# Patient Record
Sex: Female | Born: 1980 | Race: Black or African American | Hispanic: No | Marital: Single | Smoking: Current every day smoker
Health system: Southern US, Community
[De-identification: ages and names within clinical notes are randomized; demographics above are authoritative.]

## PROBLEM LIST (undated history)

## (undated) DIAGNOSIS — K5900 Constipation, unspecified: Secondary | ICD-10-CM

## (undated) DIAGNOSIS — O009 Unspecified ectopic pregnancy without intrauterine pregnancy: Secondary | ICD-10-CM

## (undated) DIAGNOSIS — K649 Unspecified hemorrhoids: Secondary | ICD-10-CM

## (undated) HISTORY — PX: TUBAL LIGATION: SHX77

---
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
## 1999-03-09 ENCOUNTER — Emergency Department (HOSPITAL_COMMUNITY): Admission: EM | Admit: 1999-03-09 | Discharge: 1999-03-09 | Payer: Self-pay | Admitting: Emergency Medicine

## 2001-05-09 ENCOUNTER — Emergency Department (HOSPITAL_COMMUNITY): Admission: EM | Admit: 2001-05-09 | Discharge: 2001-05-09 | Payer: Self-pay | Admitting: Emergency Medicine

## 2001-11-09 ENCOUNTER — Inpatient Hospital Stay (HOSPITAL_COMMUNITY): Admission: AD | Admit: 2001-11-09 | Discharge: 2001-11-09 | Payer: Self-pay | Admitting: Obstetrics and Gynecology

## 2001-11-10 ENCOUNTER — Inpatient Hospital Stay (HOSPITAL_COMMUNITY): Admission: AD | Admit: 2001-11-10 | Discharge: 2001-11-12 | Payer: Self-pay | Admitting: *Deleted

## 2002-02-21 ENCOUNTER — Emergency Department (HOSPITAL_COMMUNITY): Admission: EM | Admit: 2002-02-21 | Discharge: 2002-02-21 | Payer: Self-pay | Admitting: Emergency Medicine

## 2002-02-22 ENCOUNTER — Emergency Department (HOSPITAL_COMMUNITY): Admission: EM | Admit: 2002-02-22 | Discharge: 2002-02-22 | Payer: Self-pay | Admitting: Emergency Medicine

## 2002-02-22 ENCOUNTER — Encounter: Payer: Self-pay | Admitting: Emergency Medicine

## 2004-11-28 ENCOUNTER — Emergency Department (HOSPITAL_COMMUNITY): Admission: EM | Admit: 2004-11-28 | Discharge: 2004-11-28 | Payer: Self-pay | Admitting: Emergency Medicine

## 2004-12-03 ENCOUNTER — Emergency Department (HOSPITAL_COMMUNITY): Admission: EM | Admit: 2004-12-03 | Discharge: 2004-12-03 | Payer: Self-pay | Admitting: Emergency Medicine

## 2005-07-03 ENCOUNTER — Ambulatory Visit (HOSPITAL_COMMUNITY): Admission: RE | Admit: 2005-07-03 | Discharge: 2005-07-03 | Payer: Self-pay | Admitting: Family Medicine

## 2008-05-16 ENCOUNTER — Inpatient Hospital Stay (HOSPITAL_COMMUNITY): Admission: AD | Admit: 2008-05-16 | Discharge: 2008-05-16 | Payer: Self-pay | Admitting: Obstetrics & Gynecology

## 2008-05-19 ENCOUNTER — Ambulatory Visit: Payer: Self-pay | Admitting: Obstetrics & Gynecology

## 2008-05-19 ENCOUNTER — Inpatient Hospital Stay (HOSPITAL_COMMUNITY): Admission: AD | Admit: 2008-05-19 | Discharge: 2008-05-19 | Payer: Self-pay | Admitting: Obstetrics & Gynecology

## 2008-05-22 ENCOUNTER — Inpatient Hospital Stay (HOSPITAL_COMMUNITY): Admission: AD | Admit: 2008-05-22 | Discharge: 2008-05-22 | Payer: Self-pay | Admitting: Obstetrics and Gynecology

## 2008-05-25 ENCOUNTER — Inpatient Hospital Stay (HOSPITAL_COMMUNITY): Admission: AD | Admit: 2008-05-25 | Discharge: 2008-05-25 | Payer: Self-pay | Admitting: Obstetrics & Gynecology

## 2008-05-28 ENCOUNTER — Inpatient Hospital Stay (HOSPITAL_COMMUNITY): Admission: AD | Admit: 2008-05-28 | Discharge: 2008-05-28 | Payer: Self-pay | Admitting: Obstetrics & Gynecology

## 2008-05-30 ENCOUNTER — Ambulatory Visit: Payer: Self-pay | Admitting: Family Medicine

## 2008-05-30 ENCOUNTER — Encounter: Payer: Self-pay | Admitting: Family Medicine

## 2008-05-30 ENCOUNTER — Ambulatory Visit (HOSPITAL_COMMUNITY): Admission: AD | Admit: 2008-05-30 | Discharge: 2008-05-31 | Payer: Self-pay | Admitting: Family Medicine

## 2008-06-01 ENCOUNTER — Inpatient Hospital Stay (HOSPITAL_COMMUNITY): Admission: AD | Admit: 2008-06-01 | Discharge: 2008-06-01 | Payer: Self-pay | Admitting: Obstetrics & Gynecology

## 2008-06-05 ENCOUNTER — Inpatient Hospital Stay (HOSPITAL_COMMUNITY): Admission: AD | Admit: 2008-06-05 | Discharge: 2008-06-05 | Payer: Self-pay | Admitting: Obstetrics & Gynecology

## 2008-06-15 ENCOUNTER — Ambulatory Visit: Payer: Self-pay | Admitting: Obstetrics and Gynecology

## 2010-07-06 ENCOUNTER — Emergency Department (HOSPITAL_COMMUNITY): Admission: EM | Admit: 2010-07-06 | Discharge: 2010-07-06 | Payer: Self-pay | Admitting: Emergency Medicine

## 2010-10-23 LAB — URINALYSIS, ROUTINE W REFLEX MICROSCOPIC
Bilirubin Urine: NEGATIVE
Bilirubin Urine: NEGATIVE
Glucose, UA: NEGATIVE mg/dL
Glucose, UA: NEGATIVE mg/dL
Hgb urine dipstick: NEGATIVE
Ketones, ur: NEGATIVE mg/dL
Nitrite: NEGATIVE
Protein, ur: NEGATIVE mg/dL
Specific Gravity, Urine: 1.032 — ABNORMAL HIGH (ref 1.005–1.030)
pH: 6 (ref 5.0–8.0)

## 2010-10-23 LAB — POCT PREGNANCY, URINE: Preg Test, Ur: NEGATIVE

## 2010-12-25 NOTE — Op Note (Signed)
Sara Morris, Sara Morris             ACCOUNT NO.:  [id number]   MEDICAL RECORD NO.:  [id number]          PATIENT TYPE:  AMB   LOCATION:  [facility]                          FACILITY:  [facility]   PHYSICIAN:  Tanya S. Shawnie Pons, M.D.   DATE OF BIRTH:  Sep 10, 1980   DATE OF PROCEDURE:  05/30/2008  DATE OF DISCHARGE:                               OPERATIVE REPORT   PREOPERATIVE DIAGNOSIS:  Ruptured ectopic pregnancy, failed methotrexate  treatment x2.   POSTOPERATIVE DIAGNOSIS:  Ruptured ectopic pregnancy, failed  methotrexate treatment x2.   PROCEDURES:  Laparoscopic left salpingectomy.   SURGEON:  Shelbie Proctor. Shawnie Pons, MD   ASSISTANT:  None.   ANESTHESIA:  General and local.   FINDINGS:  Left ectopic pregnancy and hemoperitoneum,  Fitz-Hugh-Curtis  adhesions up above the liver and multiple adhesions of the patient's  left tube to the uterus.  The right tube appears normal.   SPECIMENS:  Left tube to pathology.   ESTIMATED BLOOD LOSS:  250 mL.   COMPLICATIONS:  None immediately known.   REASON FOR PROCEDURE:  Briefly, the patient is a 30 year old gravida 1  who had a probable ectopic pregnancy with nonrising beta HCGs who was  treated for an ectopic pregnancy with methotrexate on May 19, 2008.  She had followup beta's but continued to arise on day seven, so she was  treated with methotrexate the second time.  Her day 4 beta HCG had gone  down, and she came in on day 6 on acute abdomen and markedly tender.  The patient's hemoglobin had dropped from 12.8 to 11.4, and she was in  so much pain.  An ultrasound revealed hemoperitoneum, so she was taken  immediately to the OR.   PROCEDURE:  The patient was taken to the OR where she was prepped and  draped in usual sterile fashion.  She was placed in dorsal lithotomy in  [place] stirrups.  Foley catheter was placed inside the bladder.  A single-  toothed tenaculum was placed on the anterior lip of the cervix.  An  uterine manipulator was then placed  into the cervix only and around the  single-toothed tenaculum.  Attention was then turned to the abdomen.  A  6 mL of 0.25% Marcaine were injected about the umbilicus, 2 ounces were  used to elevate the umbilical skin and the knife was used to make an  incision through the umbilicus, was carried down underlying fascia.  Peritoneal cavity was grasped with hemostats and entered sharply with  Metzenbaum scissors.  Once the peritoneal cavity was entered, an S-  retractor was placed inside to hold the opening and then both edges of  the fascia were tagged with 0 Vicryl suture on the UR6.  These were then  used to secure the [facility] trocar which was placed without difficulty and  pneumoperitoneum was created.  Camera was placed inside the abdomen and  hemoperitoneum was found.  The patient's right tube was looked, that  appeared to be normal.  The patient's left tube had adherent clot to it  and could not be well visualized initially.  The liver was inspected,  Fitz-Hugh-Curtis adhesions were noted.  A 5-mm port was placed in the  left lower quadrant under direct visualization and the [place] was used  to clear blood clot away from the tube at which time an ectopic  pregnancy was detected.  There was also numerous adhesions of the left  tube to the uterus.  A second 5-mm port was placed in the left abdominal  quadrant approximately 4 cm above the first port.  This one was also  placed under direct visualization without difficulty.  Attention was  then turned to the ectopic.  The filmy adhesions were taken down between  the tube and the uterus and starting at the cornu, the tube was taken  down with gyrus, also in between coaging and cutting until the tube was  successfully removed.  The left ovary appeared to be normal.  Once the  tube was removed, a 5-mm scope was placed in the upper port and an  Endocatch bag was used to remove the specimen through the 10-mm port.  This was accomplished easily  and the Hasson was put back, and there was  some mild bleeding at the edge of the tubal site remover on the edge of  the ovary where adhesions had been taken down from the uterus.  These  were cauterized with gyrus.  The irrigation was used plus suctioned to  clean out the pelvis of clot and debris.  Excellent hemostasis was noted  at the end and the two 5-mm ports were removed under direct  visualization.  The 10-mm port was also removed without difficulty.  The  patient's 10-mm port had the two  aforementioned 0 Vicryl sutures on the  UR6, 2 figures-of-eight was used to close the fascial defect in this  location.  A 3-0 Vicryl on X1 was then used to close the umbilical skin.  A 4-0 Vicryl on a PS2 was then used to close the two 5-mm ports with the  subcuticular stitch.  All instrument and all counts were correct x2.  The patient was awakened and taken to recovery room in stable condition.      Shelbie Proctor. Shawnie Pons, M.D.  Electronically Signed     TSP/MEDQ  D:  05/30/2008  T:  05/31/2008  Job:  [id number]

## 2010-12-28 NOTE — Discharge Summary (Signed)
[facility]  Patient:    Sara Morris, Sara Morris Visit Number: [id number] MRN: [id number]          Service Type: GYN Location: [facility] Attending Physician:  Amada Kingfisher. Dictated by:   Arlis Porta, M.D. Admit Date:  11/09/2001 Discharge Date: 11/09/2001                             Discharge Summary  DISCHARGE DIAGNOSES: 1. Pelvic inflammatory disease. 2. Pyelonephritis. 3. Trichomonas.  LABORATORY DATA: 1. Urinalysis showed dehydration consistent with specific gravity of greater    than 1.030, ketones of 15, protein of 30, negative nitrite, trace leukocyte    esterase.  Microscopic urine showed many squamous cells, 11-20 white blood    cells, a few bacteria and a few Trichomonas. 2. CBC showed a white blood cell count of 4.8, hemoglobin of 13.2, hematocrit    of 39.5, platelet count of 185,000. 3. Wet prep showed rare Trichomonas, a few clue cells, moderate white blood    cells and moderate bacteria. 4. The patient was negative for gonorrhea.  She was positive for Chlamydia. 5. She was negative for syphilis. 6. Urine pregnancy test was negative.  HOSPITAL COURSE:              This is a 30 year old African American female who presented with three days worth of right-sided pain that was worse with certain movements.  She denied any vaginal discharge, odor, itching, dyspareunia or dysuria.  She was originally seen in the [facility]U on November 09, 2001, which they found possible UTI, positive Trichomonas, and positive Chlamydia. She was treated with Zithromax and Rocephin, Flagyl and Macrobid, however, she re-presented to the MAU on November 10, 2001 for a recheck, in which case she was having continued pain.  She had not been taking her medications secondary to cost.  She was therefore admitted for IV antibiotics.  She was treated with clindamycin and gentamicin.  She was also given one dose of Flagyl.  By the time of discharge, the patient was tolerating p.o.  well, with stabilization of her pain.  She has been afebrile during her admission.  She will be discharged to home on doxycycline and Bactrim for 14 days.  A prescription was given to her partner for Trichomonas as well.  She was advised to use condoms.  DISCHARGE MEDICATIONS: 1. Doxycycline 100 mg p.o. b.i.d. x14 days. 2. Bactrim double-strength one tablet p.o. b.i.d. x14 days.   ACTIVITY:                     No restrictions.  The patient is to use condoms during sexual activity.  DIET:                         No restrictions.  WOUND CARE:                   Not applicable.  SPECIAL INSTRUCTIONS:         She is to come back to the hospital for fevers, worsening pain, or vomiting.  FOLLOWUP:                     She is to call [facility] at [phone] or try the Internal Medicine Clinic at [facility] at [phone] for long-term followup care.Dictated by:   Arlis Porta, M.D. Attending Physician:  Amada Kingfisher.  DD:  11/12/01 TD:  11/13/01 Job: [id number]

## 2011-05-13 LAB — DIFFERENTIAL
Eosinophils Absolute: 0
Lymphocytes Relative: 43
Lymphocytes Relative: 53 — ABNORMAL HIGH
Lymphs Abs: 2
Lymphs Abs: 2.4
Monocytes Relative: 5
Neutro Abs: 1.9
Neutrophils Relative %: 41 — ABNORMAL LOW
Neutrophils Relative %: 50

## 2011-05-13 LAB — TYPE AND SCREEN: Antibody Screen: NEGATIVE

## 2011-05-13 LAB — CBC
HCT: 31.1 — ABNORMAL LOW
HCT: 33.4 — ABNORMAL LOW
HCT: 35.5 — ABNORMAL LOW
HCT: 36
HCT: 37.5
Hemoglobin: 10.3 — ABNORMAL LOW
Hemoglobin: 10.6 — ABNORMAL LOW
Hemoglobin: 12
Hemoglobin: 12.1
Hemoglobin: 12.7
MCHC: 33.2
MCV: 98.4
MCV: 99.7
Platelets: 140 — ABNORMAL LOW
Platelets: 176
RBC: 3.38 — ABNORMAL LOW
RBC: 3.62 — ABNORMAL LOW
RBC: 3.67 — ABNORMAL LOW
RDW: 12.5
RDW: 12.8
WBC: 4.6
WBC: 4.6
WBC: 5.3
WBC: 5.5
WBC: 5.7
WBC: 6.2

## 2011-05-13 LAB — URINE MICROSCOPIC-ADD ON

## 2011-05-13 LAB — BUN
BUN: 6
BUN: 9

## 2011-05-13 LAB — URINALYSIS, ROUTINE W REFLEX MICROSCOPIC
Bilirubin Urine: NEGATIVE
Glucose, UA: NEGATIVE
Glucose, UA: NEGATIVE
Hgb urine dipstick: NEGATIVE
Nitrite: NEGATIVE
Specific Gravity, Urine: 1.02
Specific Gravity, Urine: 1.02
pH: 6
pH: 8

## 2011-05-13 LAB — HCG, QUANTITATIVE, PREGNANCY
hCG, Beta Chain, Quant, S: 1068 — ABNORMAL HIGH
hCG, Beta Chain, Quant, S: 1077 — ABNORMAL HIGH
hCG, Beta Chain, Quant, S: 1789 — ABNORMAL HIGH
hCG, Beta Chain, Quant, S: 2020 — ABNORMAL HIGH

## 2011-05-13 LAB — GC/CHLAMYDIA PROBE AMP, GENITAL
Chlamydia, DNA Probe: NEGATIVE
GC Probe Amp, Genital: NEGATIVE

## 2011-05-13 LAB — CREATININE, SERUM
GFR calc non Af Amer: >60
GFR calc non Af Amer: >60

## 2011-05-13 LAB — URINE CULTURE

## 2011-05-13 LAB — BASIC METABOLIC PANEL
GFR calc non Af Amer: >60
Glucose, Bld: 103 — ABNORMAL HIGH
Potassium: 3.9
Sodium: 138

## 2011-05-13 LAB — ABO/RH: ABO/RH(D): A POS

## 2011-05-13 LAB — WET PREP, GENITAL: Yeast Wet Prep HPF POC: NONE SEEN

## 2012-06-21 ENCOUNTER — Encounter (HOSPITAL_BASED_OUTPATIENT_CLINIC_OR_DEPARTMENT_OTHER): Payer: Self-pay | Admitting: *Deleted

## 2012-06-21 ENCOUNTER — Emergency Department (HOSPITAL_BASED_OUTPATIENT_CLINIC_OR_DEPARTMENT_OTHER)
Admission: EM | Admit: 2012-06-21 | Discharge: 2012-06-21 | Disposition: A | Discharge status: Home / Self Care | Payer: Self-pay | Attending: Emergency Medicine | Admitting: Emergency Medicine

## 2012-06-21 ENCOUNTER — Emergency Department (HOSPITAL_BASED_OUTPATIENT_CLINIC_OR_DEPARTMENT_OTHER): Payer: Self-pay

## 2012-06-21 DIAGNOSIS — Z791 Long term (current) use of non-steroidal anti-inflammatories (NSAID): Secondary | ICD-10-CM | POA: Insufficient documentation

## 2012-06-21 DIAGNOSIS — R109 Unspecified abdominal pain: Secondary | ICD-10-CM | POA: Insufficient documentation

## 2012-06-21 DIAGNOSIS — F17200 Nicotine dependence, unspecified, uncomplicated: Secondary | ICD-10-CM | POA: Insufficient documentation

## 2012-06-21 DIAGNOSIS — R300 Dysuria: Secondary | ICD-10-CM | POA: Insufficient documentation

## 2012-06-21 LAB — URINE MICROSCOPIC-ADD ON

## 2012-06-21 LAB — URINALYSIS, ROUTINE W REFLEX MICROSCOPIC
Glucose, UA: NEGATIVE mg/dL
Leukocytes, UA: NEGATIVE
pH: 6 (ref 5.0–8.0)

## 2012-06-21 MED ORDER — OXYCODONE-ACETAMINOPHEN 5-325 MG PO TABS: 1.0000 | ORAL_TABLET | Freq: Four times a day (QID) | ORAL | Status: DC | PRN

## 2012-06-21 MED ORDER — SULFAMETHOXAZOLE-TRIMETHOPRIM 800-160 MG PO TABS: 1.0000 | ORAL_TABLET | Freq: Two times a day (BID) | ORAL | Status: DC

## 2012-06-21 MED ORDER — KETOROLAC TROMETHAMINE 60 MG/2ML IM SOLN
60.0000 mg | Freq: Once | INTRAMUSCULAR | Status: AC
  Administered 2012-06-21: 60 mg via INTRAMUSCULAR
  Filled 2012-06-21: qty 2

## 2012-06-21 NOTE — ED Provider Notes (Signed)
History    This chart was scribed for Geoffery Lyons, MD, MD by Smitty Pluck, ED Scribe. The patient was seen in room MH06 and the patient's care was started at 9:08PM.   CSN: [id number]  Arrival date & time 06/21/12  1943     Chief Complaint  Patient presents with  . Back Pain    (Consider location/radiation/quality/duration/timing/severity/associated sxs/prior treatment) The history is provided by the patient. No language interpreter was used.   Sara Morris is a 31 y.o. female who presents to the Emergency Department complaining of constant, moderate, sharp, left lower back pain onset 4 days ago. Pt reports that breathing and movement aggravates the pain. Pt reports that she feels that she relieves pressure when she urinates. She reports that she was running a fever and vomiting 1 night ago. Pt reports that she had diarrhea 4 days ago and that she has not had bowel movement since onset. Denies dysuria, injury to back, cough, headache and any other pain. Pt had CT last night at [facility] with negative results. Pt reports that she was discharged with hydrocodone without relief.   History reviewed. No pertinent past medical history.  Past Surgical History  Procedure Date  . Tubal ligation     No family history on file.  History  Substance Use Topics  . Smoking status: Current Every Day Smoker  . Smokeless tobacco: Never Used  . Alcohol Use: 8.4 oz/week    14 Cans of beer per week    OB History    Grav Para Term Preterm Abortions TAB SAB Ect Mult Living                  Review of Systems  All other systems reviewed and are negative.  10 Systems reviewed and all are negative for acute change except as noted in the HPI.    Allergies  Review of patient's allergies indicates no known allergies.  Home Medications   Current Outpatient Rx  Name  Route  Sig  Dispense  Refill  . IBUPROFEN 800 MG PO TABS   Oral   Take 800 mg by mouth every 8  (eight) hours as needed.           BP 108/58  Pulse 110  Temp 99.3 F (37.4 C) (Oral)  Resp 16  Ht 5\' 7"  (1.702 m)  Wt 165 lb (74.844 kg)  BMI 25.84 kg/m2  SpO2 100%  LMP 06/08/2012  Physical Exam  Nursing note and vitals reviewed. Constitutional: She is oriented to person, place, and time. She appears well-developed and well-nourished.  HENT:  Head: Normocephalic and atraumatic.  Pulmonary/Chest: Effort normal. No respiratory distress.  Abdominal: There is tenderness in the left lower quadrant. There is guarding (voluntary). There is no rebound.  Musculoskeletal: Normal range of motion.       No c-spine, t-spine and l-spine tenderness to palpation     Neurological: She is alert and oriented to person, place, and time.  Skin: Skin is warm and dry.  Psychiatric: She has a normal mood and affect. Her behavior is normal.    ED Course  Procedures (including critical care time) DIAGNOSTIC STUDIES: Oxygen Saturation is 100% on room air, normal by my interpretation.    COORDINATION OF CARE: 9:12 PM Discussed ED treatment with pt  9:48 PM Ordered:     . [COMPLETED] ketorolac  60 mg Intramuscular Once       Labs Reviewed  URINALYSIS, ROUTINE W REFLEX MICROSCOPIC -  Abnormal; Notable for the following:    APPearance CLOUDY (*)     Specific Gravity, Urine 1.038 (*)     Bilirubin Urine SMALL (*)     Ketones, ur 15 (*)     Protein, ur 30 (*)     Urobilinogen, UA 2.0 (*)     All other components within normal limits  URINE MICROSCOPIC-ADD ON - Abnormal; Notable for the following:    Squamous Epithelial / LPF MANY (*)     Bacteria, UA FEW (*)     All other components within normal limits  PREGNANCY, URINE   Ct Abdomen Pelvis Wo Contrast  06/21/2012  *RADIOLOGY REPORT*  Clinical Data: Left flank pain.  CT ABDOMEN AND PELVIS WITHOUT CONTRAST  Technique:  Multidetector CT imaging of the abdomen and pelvis was performed following the standard protocol without intravenous  contrast.  Comparison: 06/05/2008  Findings: Minimal dependent atelectasis in the lung bases.  Heart is normal size.  No effusions.  Stomach, gallbladder, liver, spleen, pancreas, adrenals and right kidney are unremarkable.  There is mild left perinephric stranding. No hydronephrosis.  No visible renal or ureteral stones.  Small calcifications in the pelvis felt represent phleboliths.  Trace free fluid in the pelvis.  Uterus and adnexa have an unremarkable unenhanced appearance.  Appendix visualized and is normal.  Large and small bowel grossly unremarkable.  No acute bony abnormality.  IMPRESSION: Mild left perinephric stranding without hydronephrosis or visible stones.  This may reflect changes from recent passage of stone. This could also be related to infection.  Recommend correlation with urinalysis.   Original Report Authenticated By: Charlett Nose, M.D.      No diagnosis found.    MDM  The patient presents here with severe pain in the left flank in the absence of trauma, bowel, or urinary complaints.  She was seen at [facility] last night and had a ct scan that was okay.  She was given hydrocodone and discharged.  She comes here tonight complaining of her pain being worse and wanting a "second opinion".  I attempted to reassure her, however she became tearful and told me that I "just can't tell her everything is okay".  She said she was suffering and needed an explanation for her pain.  I then repeated the ct scan which showed mild stranding around the left kidney, the significance of which I am unsure.  This could be a recently passed stone or uti, however the urine is clear.  I will treat her with bactrim and percocet.  She is to follow up with her pcp if not improving.      I personally performed the services described in this documentation, which was scribed in my presence. The recorded information has been reviewed and is accurate.      Geoffery Lyons, MD 06/22/12 1017

## 2012-06-21 NOTE — ED Notes (Signed)
Pt reports left flank pain x 4 days- pain worse with breathing and movement

## 2012-06-21 NOTE — ED Notes (Signed)
Patient took 2 vicodin around 7pm w/o relief.

## 2013-04-13 ENCOUNTER — Emergency Department (HOSPITAL_BASED_OUTPATIENT_CLINIC_OR_DEPARTMENT_OTHER)
Admission: EM | Admit: 2013-04-13 | Discharge: 2013-04-13 | Disposition: A | Discharge status: Home / Self Care | Payer: PRIVATE HEALTH INSURANCE | Attending: Emergency Medicine | Admitting: Emergency Medicine

## 2013-04-13 ENCOUNTER — Encounter (HOSPITAL_BASED_OUTPATIENT_CLINIC_OR_DEPARTMENT_OTHER): Payer: Self-pay | Admitting: Emergency Medicine

## 2013-04-13 DIAGNOSIS — K644 Residual hemorrhoidal skin tags: Secondary | ICD-10-CM | POA: Insufficient documentation

## 2013-04-13 DIAGNOSIS — F411 Generalized anxiety disorder: Secondary | ICD-10-CM | POA: Insufficient documentation

## 2013-04-13 DIAGNOSIS — F17200 Nicotine dependence, unspecified, uncomplicated: Secondary | ICD-10-CM | POA: Insufficient documentation

## 2013-04-13 DIAGNOSIS — K649 Unspecified hemorrhoids: Secondary | ICD-10-CM

## 2013-04-13 HISTORY — DX: Unspecified hemorrhoids: K64.9

## 2013-04-13 HISTORY — DX: Constipation, unspecified: K59.00

## 2013-04-13 MED ORDER — HYDROCORTISONE ACETATE 25 MG RE SUPP: 25.0000 mg | Freq: Two times a day (BID) | RECTAL | Status: DC

## 2013-04-13 NOTE — ED Provider Notes (Signed)
CSN: [id number]     Arrival date & time 04/13/13  1912 History   First MD Initiated Contact with Patient 04/13/13 1931     Chief Complaint  Patient presents with  . Hemorrhoids   (Consider location/radiation/quality/duration/timing/severity/associated sxs/prior Treatment) The history is provided by the patient.  Sara Morris is a 32 y.o. female history of hemorrhoid, constipation here presenting with worsening hemorrhoids. There is hemorrhoids for the last 4 days. Attempted to apply Preparation H cream to the area without any relief. Went to [employer] several days ago and given ProctoFoam with no relief. Today was at work and she had more bleeding and increased pain. Denies being constipated now even though she had been constipated in the past.    Past Medical History  Diagnosis Date  . Hemorrhoid   . Constipation    Past Surgical History  Procedure Laterality Date  . Tubal ligation     History reviewed. No pertinent family history. History  Substance Use Topics  . Smoking status: Current Every Day Smoker -- 1.00 packs/day    Types: Cigarettes  . Smokeless tobacco: Never Used  . Alcohol Use: 8.4 oz/week    14 Cans of beer per week   OB History   Grav Para Term Preterm Abortions TAB SAB Ect Mult Living                 Review of Systems  Gastrointestinal: Positive for blood in stool and rectal pain.  All other systems reviewed and are negative.    Allergies  Review of patient's allergies indicates no known allergies.  Home Medications   Current Outpatient Rx  Name  Route  Sig  Dispense  Refill  . hydrocortisone-pramoxine (PROCTOFOAM-HC) rectal foam   Rectal   Place 1 applicator rectally 2 (two) times daily.         . traMADol (ULTRAM) 50 MG tablet   Oral   Take 50 mg by mouth every 6 (six) hours as needed for pain.         Marland Kitchen ibuprofen (ADVIL,MOTRIN) 800 MG tablet   Oral   Take 800 mg by mouth every 8 (eight) hours as needed.         Marland Kitchen  oxyCODONE-acetaminophen (PERCOCET/ROXICET) 5-325 MG per tablet   Oral   Take 1-2 tablets by mouth every 6 (six) hours as needed for pain.   15 tablet   0   . sulfamethoxazole-trimethoprim (BACTRIM DS,SEPTRA DS) 800-160 MG per tablet   Oral   Take 1 tablet by mouth 2 (two) times daily.   10 tablet   0    BP 116/69  Pulse 87  Temp(Src) 98.4 F (36.9 C) (Oral)  Resp 18  SpO2 100%  LMP 03/28/2013 Physical Exam  Nursing note and vitals reviewed. Constitutional: She is oriented to person, place, and time. She appears well-nourished.  Tearful, anxious   HENT:  Head: Normocephalic.  Mouth/Throat: Oropharynx is clear and moist.  Eyes: Conjunctivae are normal. Pupils are equal, round, and reactive to light.  Neck: Normal range of motion. Neck supple.  Cardiovascular: Normal rate.   Pulmonary/Chest: Effort normal and breath sounds normal. No respiratory distress. She has no wheezes. She has no rales.  Abdominal: Soft. Bowel sounds are normal. She exhibits no distension. There is no tenderness. There is no rebound.  Genitourinary:  Rectal- small external hemorrhoid posterior midline. Not thrombosed. Tender on exam. No active bleeding.   Musculoskeletal: Normal range of motion.  Neurological: She is alert  and oriented to person, place, and time.  Skin: Skin is warm and dry.  Psychiatric: She has a normal mood and affect. Her behavior is normal. Judgment and thought content normal.    ED Course  Procedures (including critical care time) Labs Review Labs Reviewed - No data to display Imaging Review No results found.  MDM  No diagnosis found. [name] is a 32 y.o. female here with painful hemorrhoids that is not actively bleeding or thrombosed. I will add anusol suppository and recommend sitz bath and surgery f/u.      Richardean Canal, MD 04/13/13 [phone]

## 2013-04-13 NOTE — ED Notes (Signed)
Pt with h/o hemorrhoids, was seen at [facility] Friday, given proctofoam, pt has used medication with no relief, today at work , pain got significantly worse and bleeding increased and became darker

## 2013-06-02 IMAGING — CT CT ABD-PELV W/O CM
2 of 4 series · 16 of 46 positions shown, 18 images · non-contrast
Comparison: 06/05/2008

CLINICAL DATA: Left flank pain.

CT ABDOMEN AND PELVIS WITHOUT CONTRAST
TECHNIQUE: Multidetector CT imaging of the abdomen and pelvis was
performed following the standard protocol without intravenous
contrast.

[Series 2: renal stone < 200 lbs 5.0 b31f · axial · 0.84mm/px · z∈[-468,-38]mm · 13 of 94 slices shown, 15 images]
[im 4/94  soft-tissue]
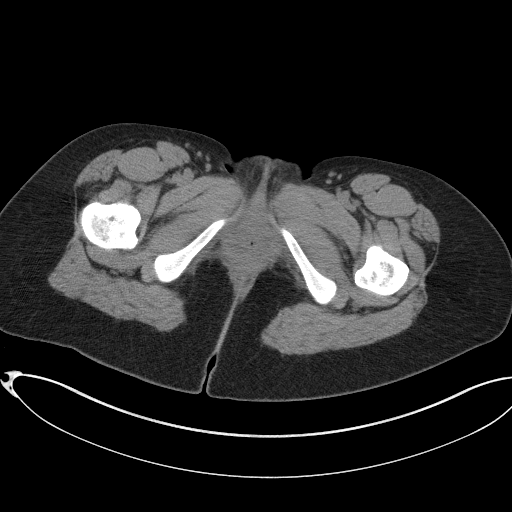
[im 4/94  bone]
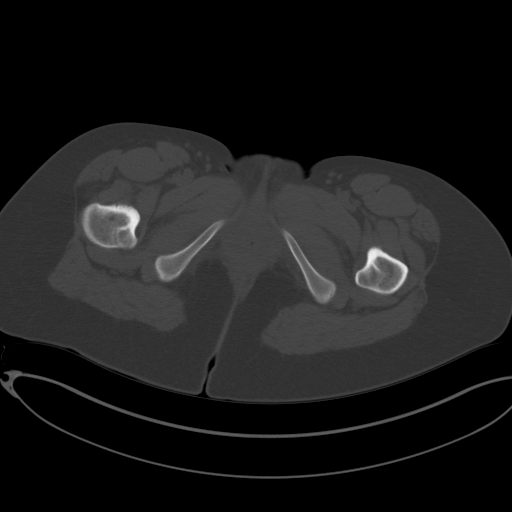
[im 12/94  soft-tissue]
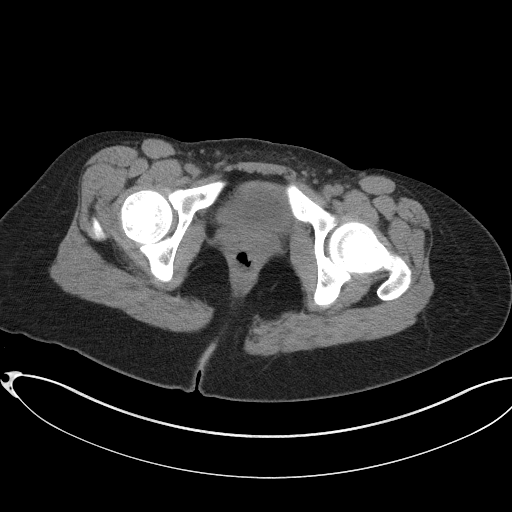
[im 20/94  soft-tissue]
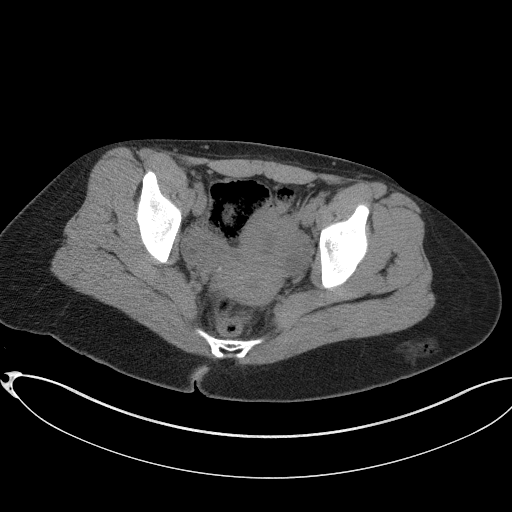
[im 28/94  soft-tissue]
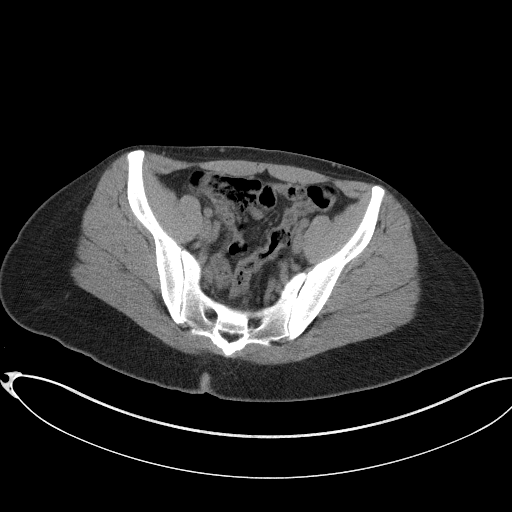
[im 32/94  soft-tissue]
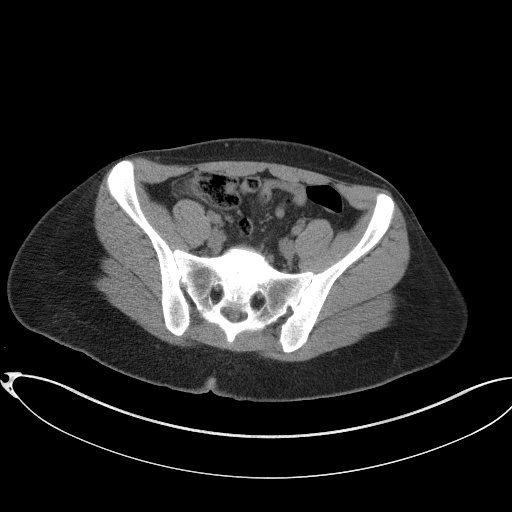
[im 39/94  soft-tissue]
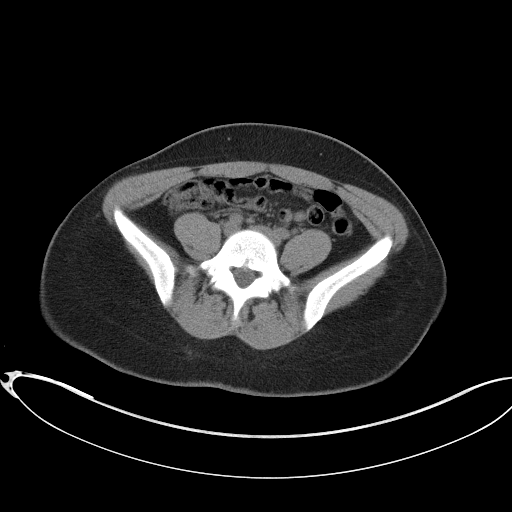
[im 47/94  soft-tissue]
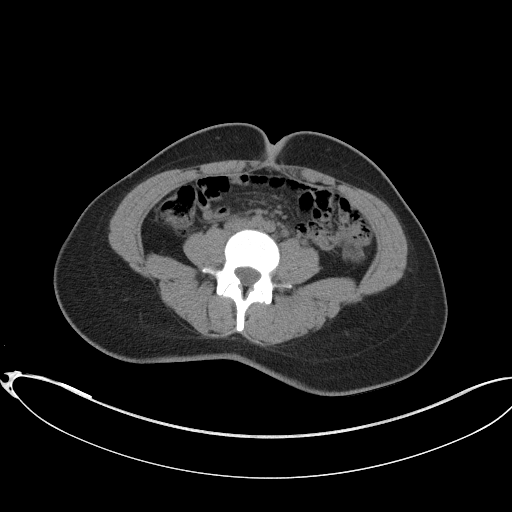
[im 55/94  soft-tissue]
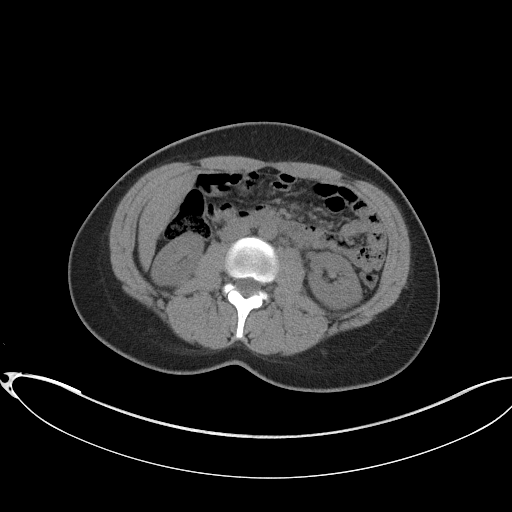
[im 63/94  soft-tissue]
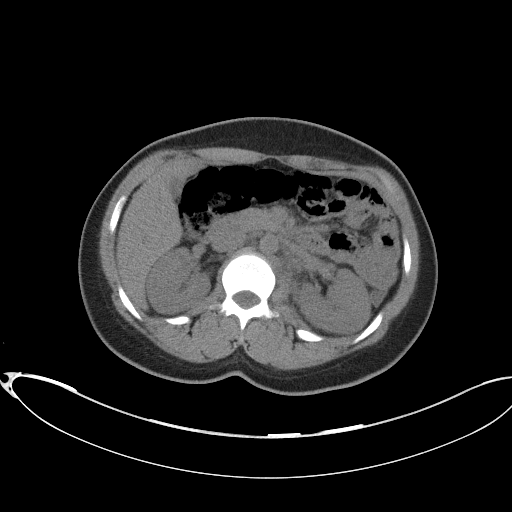
[im 63/94  bone]
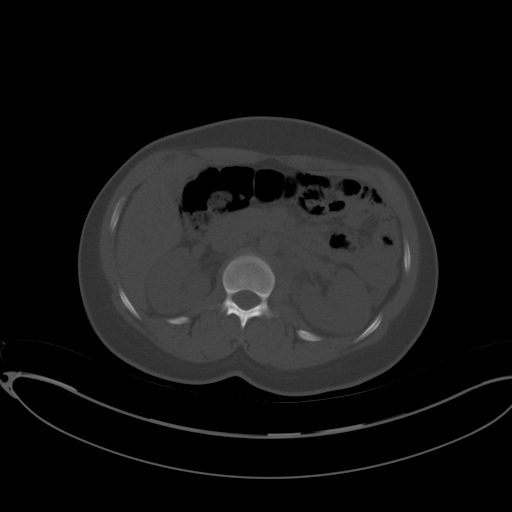
[im 66/94  soft-tissue]
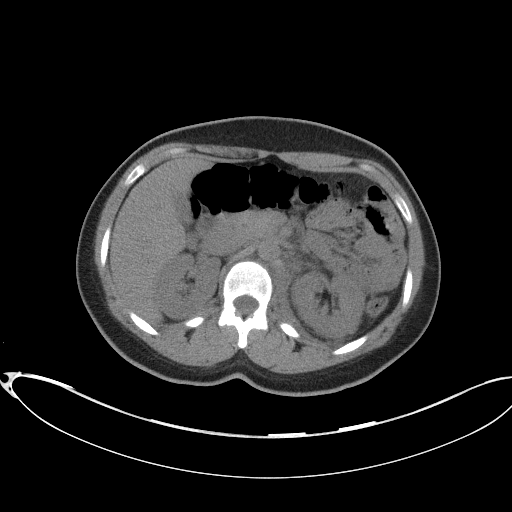
[im 74/94  soft-tissue]
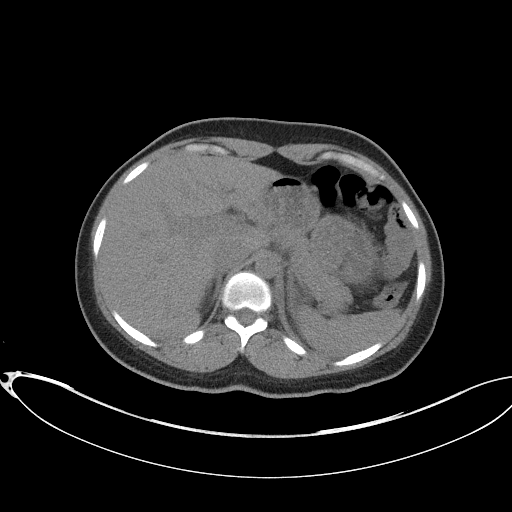
[im 82/94  soft-tissue]
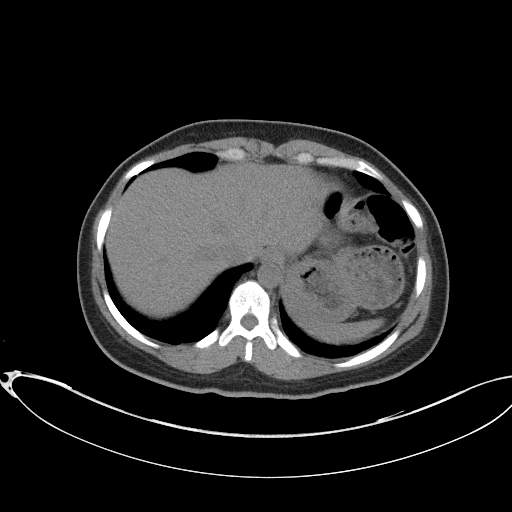
[im 90/94  soft-tissue]
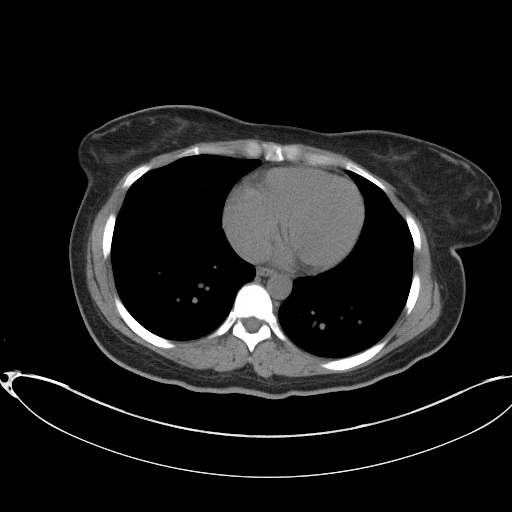

[Series 5: renal stone 3.0 coronal · coronal · 0.64mm/px · 3 of 76 slices shown]
[im 26/76  soft-tissue]
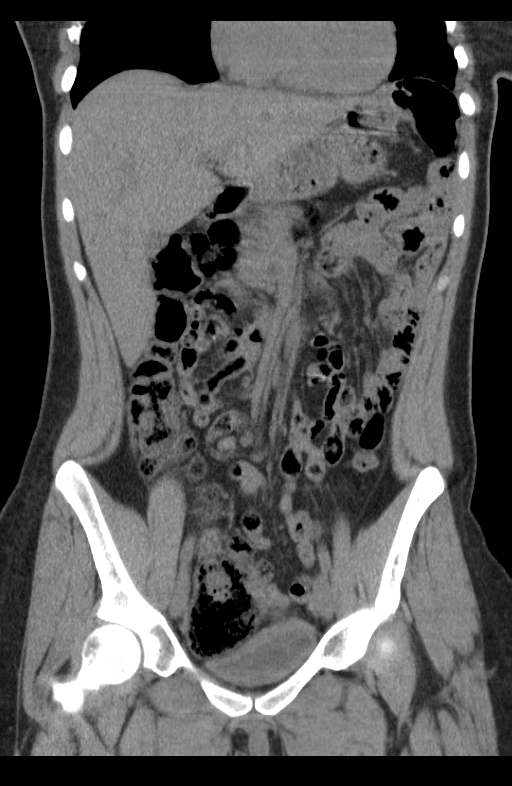
[im 34/76  soft-tissue]
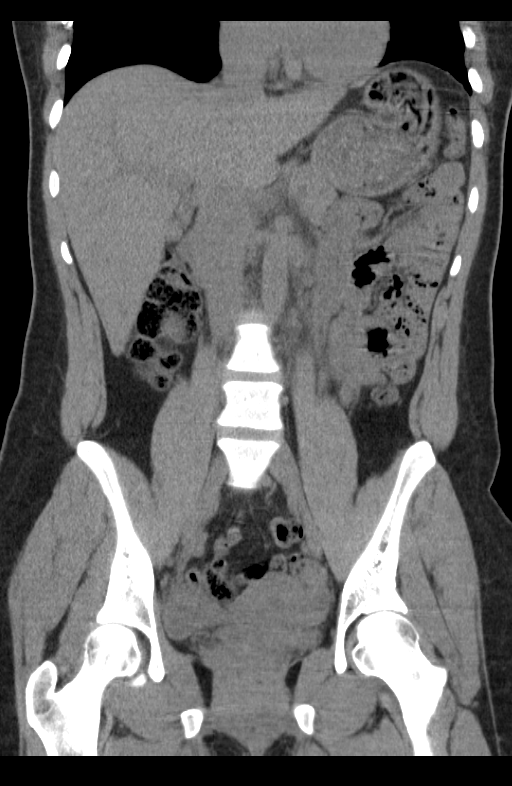
[im 42/76  soft-tissue]
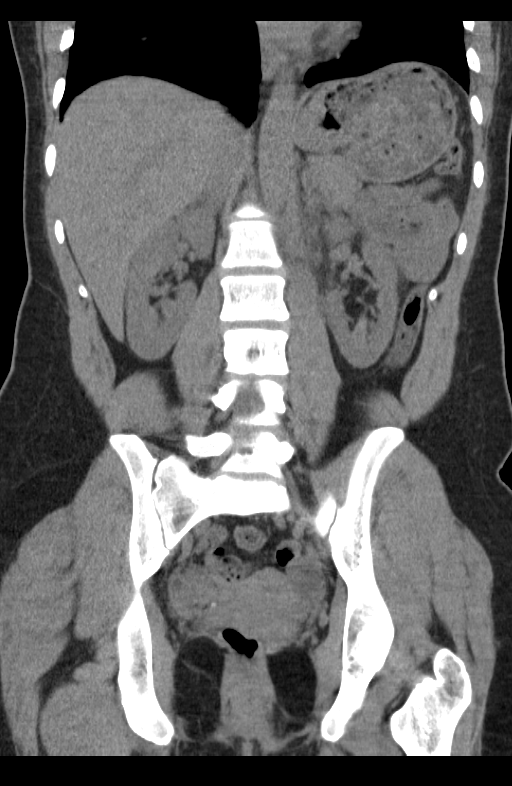

[16 of 46 positions shown; findings below may reference images not displayed]

FINDINGS: Minimal dependent atelectasis in the lung bases.  Heart
is normal size.  No effusions.

Stomach, gallbladder, liver, spleen, pancreas, adrenals and right
kidney are unremarkable.  There is mild left perinephric stranding.
No hydronephrosis.  No visible renal or ureteral stones.  Small
calcifications in the pelvis felt represent phleboliths.

Trace free fluid in the pelvis.  Uterus and adnexa have an
unremarkable unenhanced appearance.  Appendix visualized and is
normal.  Large and small bowel grossly unremarkable.

No acute bony abnormality.
IMPRESSION: Mild left perinephric stranding without hydronephrosis or visible
stones.  This may reflect changes from recent passage of stone.
This could also be related to infection.  Recommend correlation
with urinalysis.

## 2013-11-05 ENCOUNTER — Ambulatory Visit (HOSPITAL_BASED_OUTPATIENT_CLINIC_OR_DEPARTMENT_OTHER)
Admit: 2013-11-05 | Discharge: 2013-11-05 | Disposition: A | Discharge status: Home / Self Care | Payer: No Typology Code available for payment source | Attending: Emergency Medicine | Admitting: Emergency Medicine

## 2013-11-05 ENCOUNTER — Emergency Department (HOSPITAL_BASED_OUTPATIENT_CLINIC_OR_DEPARTMENT_OTHER)
Admission: EM | Admit: 2013-11-05 | Discharge: 2013-11-05 | Disposition: A | Discharge status: Home / Self Care | Payer: No Typology Code available for payment source | Attending: Emergency Medicine | Admitting: Emergency Medicine

## 2013-11-05 ENCOUNTER — Encounter (HOSPITAL_BASED_OUTPATIENT_CLINIC_OR_DEPARTMENT_OTHER): Payer: Self-pay | Admitting: Emergency Medicine

## 2013-11-05 DIAGNOSIS — N7093 Salpingitis and oophoritis, unspecified: Secondary | ICD-10-CM | POA: Insufficient documentation

## 2013-11-05 DIAGNOSIS — IMO0002 Reserved for concepts with insufficient information to code with codable children: Secondary | ICD-10-CM | POA: Insufficient documentation

## 2013-11-05 DIAGNOSIS — Z9889 Other specified postprocedural states: Secondary | ICD-10-CM | POA: Insufficient documentation

## 2013-11-05 DIAGNOSIS — Z79899 Other long term (current) drug therapy: Secondary | ICD-10-CM | POA: Insufficient documentation

## 2013-11-05 DIAGNOSIS — Z8719 Personal history of other diseases of the digestive system: Secondary | ICD-10-CM | POA: Insufficient documentation

## 2013-11-05 DIAGNOSIS — Z8679 Personal history of other diseases of the circulatory system: Secondary | ICD-10-CM | POA: Insufficient documentation

## 2013-11-05 DIAGNOSIS — Z3202 Encounter for pregnancy test, result negative: Secondary | ICD-10-CM | POA: Insufficient documentation

## 2013-11-05 DIAGNOSIS — F17200 Nicotine dependence, unspecified, uncomplicated: Secondary | ICD-10-CM | POA: Insufficient documentation

## 2013-11-05 DIAGNOSIS — K649 Unspecified hemorrhoids: Secondary | ICD-10-CM | POA: Insufficient documentation

## 2013-11-05 DIAGNOSIS — Z792 Long term (current) use of antibiotics: Secondary | ICD-10-CM | POA: Insufficient documentation

## 2013-11-05 DIAGNOSIS — Z8742 Personal history of other diseases of the female genital tract: Secondary | ICD-10-CM | POA: Insufficient documentation

## 2013-11-05 DIAGNOSIS — N390 Urinary tract infection, site not specified: Secondary | ICD-10-CM | POA: Insufficient documentation

## 2013-11-05 DIAGNOSIS — N898 Other specified noninflammatory disorders of vagina: Secondary | ICD-10-CM | POA: Insufficient documentation

## 2013-11-05 HISTORY — DX: Unspecified ectopic pregnancy without intrauterine pregnancy: O00.90

## 2013-11-05 LAB — CBC WITH DIFFERENTIAL/PLATELET
Basophils Absolute: 0 10*3/uL (ref 0.0–0.1)
Basophils Relative: 0 % (ref 0–1)
EOS ABS: 0.1 10*3/uL (ref 0.0–0.7)
EOS PCT: 1 % (ref 0–5)
HEMATOCRIT: 40.8 % (ref 36.0–46.0)
HEMOGLOBIN: 13.9 g/dL (ref 12.0–15.0)
LYMPHS ABS: 2.4 10*3/uL (ref 0.7–4.0)
LYMPHS PCT: 51 % — AB (ref 12–46)
MCH: 33.3 pg (ref 26.0–34.0)
MCHC: 34.1 g/dL (ref 30.0–36.0)
MCV: 97.8 fL (ref 78.0–100.0)
MONOS PCT: 8 % (ref 3–12)
Monocytes Absolute: 0.4 10*3/uL (ref 0.1–1.0)
Neutro Abs: 1.9 10*3/uL (ref 1.7–7.7)
Neutrophils Relative %: 40 % — ABNORMAL LOW (ref 43–77)
PLATELETS: 182 10*3/uL (ref 150–400)
RBC: 4.17 MIL/uL (ref 3.87–5.11)
RDW: 11.7 % (ref 11.5–15.5)
WBC: 4.7 10*3/uL (ref 4.0–10.5)

## 2013-11-05 LAB — URINALYSIS, ROUTINE W REFLEX MICROSCOPIC
Bilirubin Urine: NEGATIVE
Glucose, UA: NEGATIVE mg/dL
Ketones, ur: NEGATIVE mg/dL
Nitrite: NEGATIVE
PROTEIN: NEGATIVE mg/dL
SPECIFIC GRAVITY, URINE: 1.035 — AB (ref 1.005–1.030)
UROBILINOGEN UA: 1 mg/dL (ref 0.0–1.0)
pH: 6 (ref 5.0–8.0)

## 2013-11-05 LAB — WET PREP, GENITAL
Trich, Wet Prep: NONE SEEN
Yeast Wet Prep HPF POC: NONE SEEN

## 2013-11-05 LAB — URINE MICROSCOPIC-ADD ON

## 2013-11-05 LAB — GC/CHLAMYDIA PROBE AMP
CT PROBE, AMP APTIMA: NEGATIVE
GC Probe RNA: POSITIVE — AB

## 2013-11-05 LAB — PREGNANCY, URINE: PREG TEST UR: NEGATIVE

## 2013-11-05 MED ORDER — CEFTRIAXONE SODIUM 250 MG IJ SOLR
INTRAMUSCULAR | Status: AC
  Filled 2013-11-05: qty 250

## 2013-11-05 MED ORDER — AZITHROMYCIN 250 MG PO TABS
1000.0000 mg | ORAL_TABLET | Freq: Once | ORAL | Status: AC
  Administered 2013-11-05: 1000 mg via ORAL
  Filled 2013-11-05: qty 4

## 2013-11-05 MED ORDER — AZITHROMYCIN 1 G PO PACK
1.0000 g | PACK | Freq: Once | ORAL | Status: AC
  Administered 2013-11-05: 1 g via ORAL
  Filled 2013-11-05: qty 1

## 2013-11-05 MED ORDER — CEFTRIAXONE SODIUM 250 MG IJ SOLR
250.0000 mg | Freq: Once | INTRAMUSCULAR | Status: AC
Start: 2013-11-05 — End: 2013-11-05
  Administered 2013-11-05: 250 mg via INTRAMUSCULAR
  Filled 2013-11-05: qty 250

## 2013-11-05 MED ORDER — HYDROCODONE-ACETAMINOPHEN 5-325 MG PO TABS: 1.0000 | ORAL_TABLET | Freq: Four times a day (QID) | ORAL | Status: DC | PRN

## 2013-11-05 MED ORDER — NITROFURANTOIN MONOHYD MACRO 100 MG PO CAPS: 100.0000 mg | ORAL_CAPSULE | Freq: Two times a day (BID) | ORAL | Status: DC

## 2013-11-05 MED ORDER — METRONIDAZOLE 500 MG PO TABS: 500.0000 mg | ORAL_TABLET | Freq: Two times a day (BID) | ORAL | Status: DC

## 2013-11-05 MED ORDER — DEXTROSE 5 % IV SOLN
1.0000 g | INTRAVENOUS | Status: DC
  Administered 2013-11-05: 1 g via INTRAVENOUS

## 2013-11-05 MED ORDER — LIDOCAINE HCL (PF) 1 % IJ SOLN
INTRAMUSCULAR | Status: AC
  Filled 2013-11-05: qty 5

## 2013-11-05 MED ORDER — CEFTRIAXONE SODIUM 1 G IJ SOLR
INTRAMUSCULAR | Status: AC
  Filled 2013-11-05: qty 10

## 2013-11-05 MED ORDER — AZITHROMYCIN 250 MG PO TABS: ORAL_TABLET | ORAL | Status: DC

## 2013-11-05 MED ORDER — AMOXICILLIN-POT CLAVULANATE 875-125 MG PO TABS: 1.0000 | ORAL_TABLET | Freq: Two times a day (BID) | ORAL | Status: DC

## 2013-11-05 NOTE — ED Provider Notes (Signed)
Prescriptions for hydrocodone replaced to 2 prior prescription not being signed  Toy BakerAnthony T Raelene Trew, MD 11/05/13 [phone]

## 2013-11-05 NOTE — ED Notes (Signed)
MD at bedside. 

## 2013-11-05 NOTE — Discharge Instructions (Signed)
Go to [facility] if you develop worsening pain, fever, vomiting or any other problems. Call the gynecologist office today to schedule a followup visit for next week Pelvic Inflammatory Disease Pelvic inflammatory disease (PID) refers to an infection in some or all of the female organs. The infection can be in the uterus, ovaries, fallopian tubes, or the surrounding tissues in the pelvis. PID can cause abdominal or pelvic pain that comes on suddenly (acute pelvic pain). PID is a serious infection because it can lead to lasting (chronic) pelvic pain or the inability to have children (infertile).  CAUSES  The infection is often caused by the normal bacteria found in the vaginal tissues. PID may also be caused by an infection that is spread during sexual contact. PID can also occur following:   The birth of a baby.   A miscarriage.   An abortion.   Major pelvic surgery.   The use of an intrauterine device (IUD).   A sexual assault.  RISK FACTORS Certain factors can put a person at higher risk for PID, such as:  Being younger than 25 years.  Being sexually active at [place] age.  Usingnonbarrier contraception.  Havingmultiple sexual partners.  Having sex with someone who has symptoms of a genital infection.  Using oral contraception. Other times, certain behaviors can increase the possibility of getting PID, such as:  Having sex during your period.  Using a vaginal douche.  Having an intrauterine device (IUD) in place. SYMPTOMS   Abdominal or pelvic pain.   Fever.   Chills.   Abnormal vaginal discharge.  Abnormal uterine bleeding.   Unusual pain shortly after finishing your period. DIAGNOSIS  Your caregiver will choose some of the following methods to make a diagnosis, such as:   Performinga physical exam and history. A pelvic exam typically reveals a very tender uterus and surrounding pelvis.   Ordering laboratory tests including a pregnancy  test, blood tests, and urine test.  Orderingcultures of the vagina and cervix to check for a sexually transmitted infection (STI).  Performing an ultrasound.   Performing a laparoscopic procedure to look inside the pelvis.  TREATMENT   Antibiotic medicines may be prescribed and taken by mouth.   Sexual partners may be treated when the infection is caused by a sexually transmitted disease (STD).   Hospitalization may be needed to give antibiotics intravenously.  Surgery may be needed, but this is rare. It may take weeks until you are completely well. If you are diagnosed with PID, you should also be checked for human immunodeficiency virus (HIV). HOME CARE INSTRUCTIONS   If given, take your antibiotics as directed. Finish the medicine even if you start to feel better.   Only take over-the-counter or prescription medicines for pain, discomfort, or fever as directed by your caregiver.   Do not have sexual intercourse until treatment is completed or as directed by your caregiver. If PID is confirmed, your recent sexual partner(s) will need treatment.   Keep your follow-up appointments. SEEK MEDICAL CARE IF:   You have increased or abnormal vaginal discharge.   You need prescription medicine for your pain.   You vomit.   You cannot take your medicines.   Your partner has an STD.  SEEK IMMEDIATE MEDICAL CARE IF:   You have a fever.   You have increased abdominal or pelvic pain.   You have chills.   You have pain when you urinate.   You are not better after 72 hours following treatment.  MAKE SURE YOU:   Understand these instructions.  Will watch your condition.  Will get help right away if you are not doing well or get worse. Document Released: 07/29/2005 Document Revised: 11/23/2012 Document Reviewed: 07/25/2011 [facility] Patient Information 2014 [place], [place].

## 2013-11-05 NOTE — ED Notes (Signed)
EDP at bedside speaking with pt.  PO fluids provided.

## 2013-11-05 NOTE — ED Provider Notes (Addendum)
CSN: [id number]     Arrival date & time 11/05/13  1014 History   First MD Initiated Contact with Patient 11/05/13 1052     Chief Complaint  Patient presents with  . Abdominal Pain     (Consider location/radiation/quality/duration/timing/severity/associated sxs/prior Treatment) Patient is a 33 y.o. female presenting with abdominal pain. The history is provided by the patient.  Abdominal Pain  patient here after an outpatient ultrasound showed a possible tubo-ovarian abscess. Seen here earlier in the morning for at the right lower quadrant pain. She also notes vaginal discharge. She also has a history of PID. She had negative pregnancy test done today are ready. She denies any vaginal bleeding. No fever or chills. Pain characterized as sharp and worse with movement. He used Tylenol without relief.  Past Medical History  Diagnosis Date  . Hemorrhoid   . Constipation   . Ectopic pregnancy    Past Surgical History  Procedure Laterality Date  . Tubal ligation     No family history on file. History  Substance Use Topics  . Smoking status: Current Every Day Smoker -- 1.00 packs/day    Types: Cigarettes  . Smokeless tobacco: Never Used  . Alcohol Use: 8.4 oz/week    14 Cans of beer per week     Comment: weekends   OB History   Grav Para Term Preterm Abortions TAB SAB Ect Mult Living                 Review of Systems  Gastrointestinal: Positive for abdominal pain.  All other systems reviewed and are negative.      Allergies  Review of patient's allergies indicates no known allergies.  Home Medications   Current Outpatient Rx  Name  Route  Sig  Dispense  Refill  . azithromycin (ZITHROMAX) 250 MG tablet      Take all 4 tablets on the morning of Thursday, 11/11/2013.   4 tablet   0   . HYDROcodone-acetaminophen (NORCO/VICODIN) 5-325 MG per tablet   Oral   Take 1-2 tablets by mouth every 6 (six) hours as needed for moderate pain.   20 tablet   0   . hydrocortisone  (ANUSOL-HC) 25 MG suppository   Rectal   Place 1 suppository (25 mg total) rectally 2 (two) times daily. For 7 days   14 suppository   0   . hydrocortisone-pramoxine (PROCTOFOAM-HC) rectal foam   Rectal   Place 1 applicator rectally 2 (two) times daily.         Marland Kitchen ibuprofen (ADVIL,MOTRIN) 800 MG tablet   Oral   Take 800 mg by mouth every 8 (eight) hours as needed.         . nitrofurantoin, macrocrystal-monohydrate, (MACROBID) 100 MG capsule   Oral   Take 1 capsule (100 mg total) by mouth 2 (two) times daily. X 7 days   14 capsule   0   . oxyCODONE-acetaminophen (PERCOCET/ROXICET) 5-325 MG per tablet   Oral   Take 1-2 tablets by mouth every 6 (six) hours as needed for pain.   15 tablet   0   . sulfamethoxazole-trimethoprim (BACTRIM DS,SEPTRA DS) 800-160 MG per tablet   Oral   Take 1 tablet by mouth 2 (two) times daily.   10 tablet   0   . traMADol (ULTRAM) 50 MG tablet   Oral   Take 50 mg by mouth every 6 (six) hours as needed for pain.          BP 125/90  Pulse 97  Temp(Src) 97.7 F (36.5 C) (Oral)  Resp 18  Ht 5\' 6"  (1.676 m)  Wt 160 lb (72.576 kg)  BMI 25.84 kg/m2  SpO2 100%  LMP 10/22/2013 Physical Exam  Nursing note and vitals reviewed. Constitutional: She is oriented to person, place, and time. She appears well-developed and well-nourished.  Non-toxic appearance. No distress.  HENT:  Head: Normocephalic and atraumatic.  Eyes: Conjunctivae, EOM and lids are normal. Pupils are equal, round, and reactive to light.  Neck: Normal range of motion. Neck supple. No tracheal deviation present. No mass present.  Cardiovascular: Normal rate, regular rhythm and normal heart sounds.  Exam reveals no gallop.   No murmur heard. Pulmonary/Chest: Effort normal and breath sounds normal. No stridor. No respiratory distress. She has no decreased breath sounds. She has no wheezes. She has no rhonchi. She has no rales.  Abdominal: Soft. Normal appearance and bowel sounds  are normal. She exhibits no distension. There is tenderness in the right lower quadrant. There is guarding. There is no rigidity, no rebound and no CVA tenderness.    Musculoskeletal: Normal range of motion. She exhibits no edema and no tenderness.  Neurological: She is alert and oriented to person, place, and time. She has normal strength. No cranial nerve deficit or sensory deficit. GCS eye subscore is 4. GCS verbal subscore is 5. GCS motor subscore is 6.  Skin: Skin is warm and dry. No abrasion and no rash noted.  Psychiatric: She has a normal mood and affect. Her speech is normal and behavior is normal.    ED Course  Procedures (including critical care time) Labs Review Labs Reviewed - No data to display Imaging Review [place] Transvaginal Non-ob  11/05/2013   CLINICAL DATA:  Pain. Prior history of ectopic pregnancy.  EXAM: TRANSABDOMINAL ULTRASOUND OF PELVIS  TECHNIQUE: Transabdominal ultrasound examination of the pelvis was performed including evaluation of the uterus, ovaries, adnexal regions, and pelvic cul-de-sac.  COMPARISON:  US TRANSVAGINAL NON-OB dated 11/05/2013; CT ABD/PELV WO CM dated 06/21/2012; US TRANSVAGINAL NON-OB dated 07/06/2010  FINDINGS: Uterus  Measurements: 7.8 x 3.3 x 4.6 cm . Tiny 0.6 cm-4 region that noted in the posterior fundus, most likely tiny fibroid.  Endometrium  Thickness: 11 mm . No focal abnormality visualized.  Right ovary  Measurements: 5.0 x 3.4 x 4.2 cm. Multiple follicular cysts are noted the largest measuring 3.3 cm in maximum diameter. . A prominent tubular non peristalsing fluid-filled structure is noted in the right adnexal region adjacent to the ovary. This could represent a prominent fallopian tube/hydrosalpinx. The possibility of gynecologic pathology including tubo-ovarian abscess and or ectopic pregnancy cannot be excluded.  Left ovary  Measurements: 4.1 x 2.6 x 2.4 cm . Multiple small follicles noted.  Other findings: Small amount.  IMPRESSION:  Prominent tubular fluid-filled non peristalsing structure noted adjacent to the right ovary. This suggested a fluid-filled fallopian tube/hydrosalpinx. Gynecologic pathology including tubo-ovarian abscess and/or ectopic pregnancy cannot be excluded. These results will be called to the ordering clinician or representative by the Radiologist Assistant, and communication documented in the PACS Dashboard.   Electronically Signed   By: Maisie Fushomas  Register   On: 11/05/2013 10:12   [place] Pelvis Complete  11/05/2013   CLINICAL DATA:  Pain.  Prior history of ectopic pregnancy.  EXAM: TRANSABDOMINAL ULTRASOUND OF PELVIS  TECHNIQUE: Transabdominal ultrasound examination of the pelvis was performed including evaluation of the uterus, ovaries, adnexal regions, and pelvic cul-de-sac.  COMPARISON:  US TRANSVAGINAL NON-OB dated 11/05/2013; CT ABD/PELV  WO CM dated 06/21/2012; US TRANSVAGINAL NON-OB dated 07/06/2010  FINDINGS: Uterus  Measurements: 7.8 x 3.3 x 4.6 cm. Tiny 0.6 cm-4 region that noted in the posterior fundus, most likely tiny fibroid.  Endometrium  Thickness: 11 mm.  No focal abnormality visualized.  Right ovary  Measurements: 5.0 x 3.4 x 4.2 cm. Multiple follicular cysts are noted the largest measuring 3.3 cm in maximum diameter. A prominent tubular non peristalsing fluid-filled structure is noted in the right adnexal region adjacent to the ovary. This could represent a prominent fallopian tube/hydrosalpinx. The possibility of gynecologic pathology including tubo-ovarian abscess and or ectopic pregnancy cannot be excluded.  Left ovary  Measurements: 4.1 x 2.6 x 2.4 cm. Multiple small follicles noted.  Other findings:  Small amount.  IMPRESSION: Prominent tubular fluid-filled non peristalsing structure noted adjacent to the right ovary. This suggested a fluid-filled fallopian tube/hydrosalpinx. Gynecologic pathology including tubo-ovarian abscess and/or ectopic pregnancy cannot be excluded. These results will be called to  the ordering clinician or representative by the Radiologist Assistant, and communication documented in the PACS Dashboard.   Electronically Signed   By: Maisie Fus  Register   On: 11/05/2013 10:02     EKG Interpretation None      MDM   Final diagnoses:  None    Spoke with dr. Erin Fulling about patient's current condition. Patient is nontoxic-appearing. Patient given dose of IV Rocephin here. She Has no leukocytosis on her CBC. Patient will be given a prescription for Augmentin and Flagyl per her recommendations. Patient will followup with her next week and was given strict return precautions.    Toy Baker, MD 11/05/13 1136  Toy Baker, MD 11/05/13 [phone]

## 2013-11-05 NOTE — ED Notes (Signed)
Abdominal pain- seen in ED last night, Ultrasound this am and told she has an ectopic pregnancy

## 2013-11-05 NOTE — ED Notes (Signed)
Pt reports that she has had an increased need to void, but unable to empty her bladder. Also reports vaginal discharge and lower abdominal pain

## 2013-11-05 NOTE — ED Notes (Signed)
abd pain x 1 week that comes and goes,  "Leaking brownish discharge"  Sharp pain in vaginal area,  Denies burning w urination

## 2013-11-05 NOTE — ED Provider Notes (Signed)
CSN: [id number]     Arrival date & time 11/05/13  0218 History   First MD Initiated Contact with Patient 11/05/13 0243     Chief Complaint  Patient presents with  . Abdominal Pain     (Consider location/radiation/quality/duration/timing/severity/associated sxs/prior Treatment) HPI This is a 33 year old female with a one-week history of lower abdominal pain along with urinary urgency and incontinence of small amounts of urine. The pain is moderate and is constant. It is improved with lying supine but worse with standing or walking. She's noticed a brownish discharge in her underwear but is not sure if this is from her vagina or from her urine. She has also had intermittent sharp pains in her vagina. She has had nausea but no vomiting, diarrhea or constipation. She has not had a fever.  Past Medical History  Diagnosis Date  . Hemorrhoid   . Constipation    Past Surgical History  Procedure Laterality Date  . Tubal ligation     History reviewed. No pertinent family history. History  Substance Use Topics  . Smoking status: Current Every Day Smoker -- 0.50 packs/day    Types: Cigarettes  . Smokeless tobacco: Never Used  . Alcohol Use: 8.4 oz/week    14 Cans of beer per week   OB History   Grav Para Term Preterm Abortions TAB SAB Ect Mult Living                 Review of Systems  All other systems reviewed and are negative.   Allergies  Review of patient's allergies indicates no known allergies.  Home Medications   Current Outpatient Rx  Name  Route  Sig  Dispense  Refill  . hydrocortisone (ANUSOL-HC) 25 MG suppository   Rectal   Place 1 suppository (25 mg total) rectally 2 (two) times daily. For 7 days   14 suppository   0   . hydrocortisone-pramoxine (PROCTOFOAM-HC) rectal foam   Rectal   Place 1 applicator rectally 2 (two) times daily.         Marland Kitchen. ibuprofen (ADVIL,MOTRIN) 800 MG tablet   Oral   Take 800 mg by mouth every 8 (eight) hours as needed.         Marland Kitchen.  oxyCODONE-acetaminophen (PERCOCET/ROXICET) 5-325 MG per tablet   Oral   Take 1-2 tablets by mouth every 6 (six) hours as needed for pain.   15 tablet   0   . sulfamethoxazole-trimethoprim (BACTRIM DS,SEPTRA DS) 800-160 MG per tablet   Oral   Take 1 tablet by mouth 2 (two) times daily.   10 tablet   0   . traMADol (ULTRAM) 50 MG tablet   Oral   Take 50 mg by mouth every 6 (six) hours as needed for pain.          BP 106/68  Pulse 82  Temp(Src) 98.5 F (36.9 C) (Oral)  Resp 18  Ht 5\' 7"  (1.702 m)  Wt 160 lb (72.576 kg)  BMI 25.05 kg/m2  SpO2 100%  LMP 10/22/2013  Physical Exam General: Well-developed, well-nourished female in no acute distress; appearance consistent with age of record HENT: normocephalic; atraumatic Eyes: pupils equal, round and reactive to light; extraocular muscles intact Neck: supple Heart: regular rate and rhythm; no murmurs, rubs or gallops Lungs: clear to auscultation bilaterally Abdomen: soft; nondistended; lower abdominal tenderness; no masses or hepatosplenomegaly; bowel sounds present GU: No CVA tenderness;  Greenish, mucoid vaginal discharge; cervical motion tenderness; right adnexal tenderness Extremities: No deformity; full range  of motion; pulses normal Neurologic: Awake, alert and oriented; motor function intact in all extremities and symmetric; no facial droop Skin: Warm and dry Psychiatric: Tearful    ED Course  Procedures (including critical care time)   MDM   Nursing notes and vitals signs, including pulse oximetry, reviewed.  Summary of this visit's results, reviewed by myself:  Labs:  Results for orders placed during the hospital encounter of 11/05/13 (from the past 24 hour(s))  URINALYSIS, ROUTINE W REFLEX MICROSCOPIC     Status: Abnormal   Collection Time    11/05/13  2:20 AM      Result Value Ref Range   Color, Urine YELLOW  YELLOW   APPearance CLOUDY (*) CLEAR   Specific Gravity, Urine 1.035 (*) 1.005 - 1.030   pH  6.0  5.0 - 8.0   Glucose, UA NEGATIVE  NEGATIVE mg/dL   Hgb urine dipstick MODERATE (*) NEGATIVE   Bilirubin Urine NEGATIVE  NEGATIVE   Ketones, ur NEGATIVE  NEGATIVE mg/dL   Protein, ur NEGATIVE  NEGATIVE mg/dL   Urobilinogen, UA 1.0  0.0 - 1.0 mg/dL   Nitrite NEGATIVE  NEGATIVE   Leukocytes, UA MODERATE (*) NEGATIVE  PREGNANCY, URINE     Status: None   Collection Time    11/05/13  2:20 AM      Result Value Ref Range   Preg Test, Ur NEGATIVE  NEGATIVE  URINE MICROSCOPIC-ADD ON     Status: Abnormal   Collection Time    11/05/13  2:20 AM      Result Value Ref Range   Squamous Epithelial / LPF MANY (*) RARE   WBC, UA 21-50  <3 WBC/hpf   RBC / HPF 0-2  <3 RBC/hpf   Bacteria, UA MANY (*) RARE   Urine-Other MUCOUS PRESENT    WET PREP, GENITAL     Status: Abnormal   Collection Time    11/05/13  3:06 AM      Result Value Ref Range   Yeast Wet Prep HPF POC NONE SEEN  NONE SEEN   Trich, Wet Prep NONE SEEN  NONE SEEN   Clue Cells Wet Prep HPF POC FEW (*) NONE SEEN   WBC, Wet Prep HPF POC MODERATE (*) NONE SEEN   3:36 AM We will treat for urinary tract infection but the patient cervical motion tenderness, vaginal discharge and right adnexal tenderness or concerning for a pelvic infection. We will treat for this as well and have her return later this morning for pelvic ultrasound to evaluate for tubo-ovarian abscess or cyst.     Hanley Seamen, MD 11/05/13 [phone]

## 2013-11-05 NOTE — ED Notes (Signed)
Pt also states that she has been "leaking" from her vagina, denies recent sexual partners, reports that when she wipes there is a dark brown or bloody tinge. No frank bleeding or clots

## 2013-11-06 ENCOUNTER — Telehealth (HOSPITAL_BASED_OUTPATIENT_CLINIC_OR_DEPARTMENT_OTHER): Payer: Self-pay | Admitting: Emergency Medicine

## 2013-11-06 LAB — URINE CULTURE

## 2013-11-06 NOTE — Telephone Encounter (Signed)
+  Gonorrhea. Patient treated with Rocephin. [employer] faxed. 

## 2013-11-10 ENCOUNTER — Ambulatory Visit (INDEPENDENT_AMBULATORY_CARE_PROVIDER_SITE_OTHER): Payer: No Typology Code available for payment source | Admitting: Obstetrics & Gynecology

## 2013-11-10 ENCOUNTER — Encounter: Payer: Self-pay | Admitting: Obstetrics & Gynecology

## 2013-11-10 VITALS — BP 110/67 | HR 76 | Temp 98.6°F | Resp 20 | Ht 68.0 in | Wt 160.2 lb

## 2013-11-10 DIAGNOSIS — N7093 Salpingitis and oophoritis, unspecified: Secondary | ICD-10-CM

## 2013-11-10 NOTE — Progress Notes (Signed)
  HPI:  Pt presents for f/u from ER visit with u/s findings concerning for TOA. She had gone to see her PCP for R sided pelvic pain and was sent in to to the ER. There, she tested positive for gonorrhea. She was given a dose of IV rocephin in the ER and discharged with rx for augmentin and flagyl. She states she has been compliant with this therapy. Her pain has improved in intensity and frequency since she was seen in the ER. She is having some LLQ pain now as well as RLQ. Has not had any fevers. Does endorse some nausea. Is not on any birth control. Last intercourse was March 8, and she did not use a condom. Prior to that had not had intercourse for several months. She has informed that partner.  ROS: See HPI  PMFSH: G1P0010, hx L ruptured ectopic pregnancy in 2009 with subsequent L salpingectomy  PHYSICAL EXAM: BP 110/67  Pulse 76  Temp(Src) 98.6 F (37 C) (Oral)  Resp 20  Ht 5\' 8"  (1.727 m)  Wt 160 lb 3.2 oz (72.666 kg)  BMI 24.36 kg/m2  LMP 10/22/2013 Gen: NAD, well appearing, well hydrated HEENT: NCAT Abdomen: soft, mild tenderness over RLQ. No masses or organomegaly appreciated. No peritoneal signs. Neuro: grossly nonfocal, speech normal, sad affect when discussing future fertility GU: not examined  IMAGING: 3/27 Pelvic U/S: Prominent tubular fluid-filled non peristalsing structure noted adjacent to the right ovary. This suggested a fluid-filled fallopian tube/hydrosalpinx. Gynecologic pathology including tubo-ovarian abscess and/or ectopic pregnancy cannot be excluded.  ASSESSMENT/PLAN:  See problem based charting for additional assessment/plan.  FOLLOW UP: F/u in 2 weeks for TOA.  GrenadaBrittany J. Pollie MeyerMcIntyre, MD [facility]

## 2013-11-10 NOTE — Progress Notes (Signed)
Patient ID: Sara Morris, female   DOB: 12/12/1980, 33 y.o.   MRN: [id number] Attestation of Attending Supervision of Resident: Evaluation and management procedures were performed [facility] under my supervision.  I have seen and examined the patient, reviewed the resident's note and chart, and I agree with the management and plan.   She will f/u in 2 weeks after the completion of her atbx.  She will f/u sooner prn worsening of the pain.  She is aware that she may need an HSG to confirm tubal patency after infection completely cleared.   Sara Morris, M.D. 11/10/2013 4:52 PM

## 2013-11-10 NOTE — Assessment & Plan Note (Signed)
Discussed with patient the importance of completing antibiotic course in terms of preserving her fertility. She has clinically improved on this regimen. Will have her f/u in 2 weeks to ensure she continues to improve. Will eventually benefit from HSG to assess adequacy of her remaining fallopian tube (she is s/p L salpingectomy). Also discussed importance of safe sex moving forward.

## 2013-11-10 NOTE — Patient Instructions (Signed)
Follow up with Dr. Erin FullingHarraway-Smith in 2 weeks. Finish every pill of your antibiotics Use condoms for safe sex, every time.  Pelvic Inflammatory Disease Pelvic inflammatory disease (PID) refers to an infection in some or all of the female organs. The infection can be in the uterus, ovaries, fallopian tubes, or the surrounding tissues in the pelvis. PID can cause abdominal or pelvic pain that comes on suddenly (acute pelvic pain). PID is a serious infection because it can lead to lasting (chronic) pelvic pain or the inability to have children (infertile).  CAUSES  The infection is often caused by the normal bacteria found in the vaginal tissues. PID may also be caused by an infection that is spread during sexual contact. PID can also occur following:   The birth of a baby.   A miscarriage.   An abortion.   Major pelvic surgery.   The use of an intrauterine device (IUD).   A sexual assault.  RISK FACTORS Certain factors can put a person at higher risk for PID, such as:  Being younger than 25 years.  Being sexually active at [place] age.  Usingnonbarrier contraception.  Havingmultiple sexual partners.  Having sex with someone who has symptoms of a genital infection.  Using oral contraception. Other times, certain behaviors can increase the possibility of getting PID, such as:  Having sex during your period.  Using a vaginal douche.  Having an intrauterine device (IUD) in place. SYMPTOMS   Abdominal or pelvic pain.   Fever.   Chills.   Abnormal vaginal discharge.  Abnormal uterine bleeding.   Unusual pain shortly after finishing your period. DIAGNOSIS  Your caregiver will choose some of the following methods to make a diagnosis, such as:   Performinga physical exam and history. A pelvic exam typically reveals a very tender uterus and surrounding pelvis.   Ordering laboratory tests including a pregnancy test, blood tests, and urine  test.  Orderingcultures of the vagina and cervix to check for a sexually transmitted infection (STI).  Performing an ultrasound.   Performing a laparoscopic procedure to look inside the pelvis.  TREATMENT   Antibiotic medicines may be prescribed and taken by mouth.   Sexual partners may be treated when the infection is caused by a sexually transmitted disease (STD).   Hospitalization may be needed to give antibiotics intravenously.  Surgery may be needed, but this is rare. It may take weeks until you are completely well. If you are diagnosed with PID, you should also be checked for human immunodeficiency virus (HIV). HOME CARE INSTRUCTIONS   If given, take your antibiotics as directed. Finish the medicine even if you start to feel better.   Only take over-the-counter or prescription medicines for pain, discomfort, or fever as directed by your caregiver.   Do not have sexual intercourse until treatment is completed or as directed by your caregiver. If PID is confirmed, your recent sexual partner(s) will need treatment.   Keep your follow-up appointments. SEEK MEDICAL CARE IF:   You have increased or abnormal vaginal discharge.   You need prescription medicine for your pain.   You vomit.   You cannot take your medicines.   Your partner has an STD.  SEEK IMMEDIATE MEDICAL CARE IF:   You have a fever.   You have increased abdominal or pelvic pain.   You have chills.   You have pain when you urinate.   You are not better after 72 hours following treatment.  MAKE SURE YOU:   Understand  these instructions.  Will watch your condition.  Will get help right away if you are not doing well or get worse. Document Released: 07/29/2005 Document Revised: 11/23/2012 Document Reviewed: 07/25/2011 [facility] Patient Information 2014 [place], [place].

## 2013-11-24 ENCOUNTER — Encounter: Payer: Self-pay | Admitting: Obstetrics & Gynecology

## 2013-11-24 ENCOUNTER — Ambulatory Visit (INDEPENDENT_AMBULATORY_CARE_PROVIDER_SITE_OTHER): Payer: No Typology Code available for payment source | Admitting: Obstetrics & Gynecology

## 2013-11-24 VITALS — BP 99/67 | HR 88 | Temp 98.4°F | Wt 165.8 lb

## 2013-11-24 DIAGNOSIS — N730 Acute parametritis and pelvic cellulitis: Secondary | ICD-10-CM

## 2013-11-24 MED ORDER — FLUCONAZOLE 150 MG PO TABS: 150.0000 mg | ORAL_TABLET | Freq: Once | ORAL | Status: DC

## 2013-11-24 NOTE — Patient Instructions (Signed)
Pelvic Inflammatory Disease  Pelvic inflammatory disease (PID) refers to an infection in some or all of the female organs. The infection can be in the uterus, ovaries, fallopian tubes, or the surrounding tissues in the pelvis. PID can cause abdominal or pelvic pain that comes on suddenly (acute pelvic pain). PID is a serious infection because it can lead to lasting (chronic) pelvic pain or the inability to have children (infertile).   CAUSES   The infection is often caused by the normal bacteria found in the vaginal tissues. PID may also be caused by an infection that is spread during sexual contact. PID can also occur following:   · The birth of a baby.    · A miscarriage.    · An abortion.    · Major pelvic surgery.    · The use of an intrauterine device (IUD).    · A sexual assault.    RISK FACTORS  Certain factors can put a person at higher risk for PID, such as:  · Being younger than 25 years.  · Being sexually active at a young age.  · Using nonbarrier contraception.  · Having multiple sexual partners.  · Having sex with someone who has symptoms of a genital infection.  · Using oral contraception.  Other times, certain behaviors can increase the possibility of getting PID, such as:  · Having sex during your period.  · Using a vaginal douche.  · Having an intrauterine device (IUD) in place.  SYMPTOMS   · Abdominal or pelvic pain.    · Fever.    · Chills.    · Abnormal vaginal discharge.  · Abnormal uterine bleeding.    · Unusual pain shortly after finishing your period.  DIAGNOSIS   Your caregiver will choose some of the following methods to make a diagnosis, such as:   · Performing a physical exam and history. A pelvic exam typically reveals a very tender uterus and surrounding pelvis.    · Ordering laboratory tests including a pregnancy test, blood tests, and urine test.   · Ordering cultures of the vagina and cervix to check for a sexually transmitted infection (STI).  · Performing an ultrasound.     · Performing a laparoscopic procedure to look inside the pelvis.    TREATMENT   · Antibiotic medicines may be prescribed and taken by mouth.    · Sexual partners may be treated when the infection is caused by a sexually transmitted disease (STD).    · Hospitalization may be needed to give antibiotics intravenously.  · Surgery may be needed, but this is rare.  It may take weeks until you are completely well. If you are diagnosed with PID, you should also be checked for human immunodeficiency virus (HIV).    HOME CARE INSTRUCTIONS   · If given, take your antibiotics as directed. Finish the medicine even if you start to feel better.    · Only take over-the-counter or prescription medicines for pain, discomfort, or fever as directed by your caregiver.    · Do not have sexual intercourse until treatment is completed or as directed by your caregiver. If PID is confirmed, your recent sexual partner(s) will need treatment.    · Keep your follow-up appointments.  SEEK MEDICAL CARE IF:   · You have increased or abnormal vaginal discharge.    · You need prescription medicine for your pain.    · You vomit.    · You cannot take your medicines.    · Your partner has an STD.    SEEK IMMEDIATE MEDICAL CARE IF:   · You have a fever.    · You have increased abdominal or   pelvic pain.    · You have chills.    · You have pain when you urinate.    · You are not better after 72 hours following treatment.    MAKE SURE YOU:   · Understand these instructions.  · Will watch your condition.  · Will get help right away if you are not doing well or get worse.  Document Released: 07/29/2005 Document Revised: 11/23/2012 Document Reviewed: 07/25/2011  ExitCare® Patient Information ©2014 [employer].

## 2013-11-24 NOTE — Progress Notes (Signed)
Subjective:     Patient ID: Sara Morris, female   DOB: 06/22/1981, 33 y.o.   MRN: [id number]  HPI Pt was seen 11/10/2013.  At that time she was dx'd with possible TOA and she had significant pain.  She reports that she still has 1 week of atbx as she has been forgetting to take it (due to not eating twice a day).  She reports that her pain is almost competely resolved.  She does c/o vulvar irritation and discharge that she believes to be a yeast infxn since she's been on meds.    Review of Systems     Objective:   Physical Exam BP 99/67  Pulse 88  Temp(Src) 98.4 F (36.9 C) (Oral)  Wt 165 lb 12.8 oz (75.206 kg)  LMP 11/18/2013 Pt in NAD Abd; soft, ND. Slightly tender on the left side but, much improved from last exam      11/05/2013 EXAM:  TRANSABDOMINAL ULTRASOUND OF PELVIS  TECHNIQUE:  Transabdominal ultrasound examination of the pelvis was performed  including evaluation of the uterus, ovaries, adnexal regions, and  pelvic cul-de-sac.  COMPARISON: US TRANSVAGINAL NON-OB dated 11/05/2013; CT ABD/PELV WO  CM dated 06/21/2012; US TRANSVAGINAL NON-OB dated 07/06/2010  FINDINGS:  Uterus  Measurements: 7.8 x 3.3 x 4.6 cm . Tiny 0.6 cm-4 region that noted  in the posterior fundus, most likely tiny fibroid.  Endometrium  Thickness: 11 mm . No focal abnormality visualized.  Right ovary  Measurements: 5.0 x 3.4 x 4.2 cm. Multiple follicular cysts are  noted the largest measuring 3.3 cm in maximum diameter. . A  prominent tubular non peristalsing fluid-filled structure is noted  in the right adnexal region adjacent to the ovary. This could  represent a prominent fallopian tube/hydrosalpinx. The possibility  of gynecologic pathology including tubo-ovarian abscess and or  ectopic pregnancy cannot be excluded.  Left ovary  Measurements: 4.1 x 2.6 x 2.4 cm . Multiple small follicles noted.  Other findings: Small amount.  IMPRESSION:  Prominent tubular fluid-filled non peristalsing  structure noted  adjacent to the right ovary. This suggested a fluid-filled fallopian  tube/hydrosalpinx. Gynecologic pathology including tubo-ovarian  abscess and/or ectopic pregnancy cannot be excluded. These results  will be called to the ordering clinician or representative by the  Radiologist Assistant, and communication documented in the PACS  Dashboard.  Assessment:     F/u PID/TOA-  Pt NOT taking meds correctly.    Plan:     Diflucan 150mg  po x 1 for yeast vaginitis encourage pt to complete atbx- reviewed with pt the risks of NOT taking atbx and of tubal damage F/u prn Pt reports h/o an abnormal PAP.  She is will sign release and get records brought to this ofc

## 2014-03-15 ENCOUNTER — Emergency Department (HOSPITAL_BASED_OUTPATIENT_CLINIC_OR_DEPARTMENT_OTHER): Payer: No Typology Code available for payment source

## 2014-03-15 ENCOUNTER — Emergency Department (HOSPITAL_BASED_OUTPATIENT_CLINIC_OR_DEPARTMENT_OTHER)
Admission: EM | Admit: 2014-03-15 | Discharge: 2014-03-15 | Disposition: A | Discharge status: Home / Self Care | Payer: No Typology Code available for payment source | Attending: Emergency Medicine | Admitting: Emergency Medicine

## 2014-03-15 ENCOUNTER — Encounter (HOSPITAL_BASED_OUTPATIENT_CLINIC_OR_DEPARTMENT_OTHER): Payer: Self-pay | Admitting: Emergency Medicine

## 2014-03-15 DIAGNOSIS — IMO0002 Reserved for concepts with insufficient information to code with codable children: Secondary | ICD-10-CM | POA: Insufficient documentation

## 2014-03-15 DIAGNOSIS — Z79899 Other long term (current) drug therapy: Secondary | ICD-10-CM | POA: Insufficient documentation

## 2014-03-15 DIAGNOSIS — J029 Acute pharyngitis, unspecified: Secondary | ICD-10-CM

## 2014-03-15 DIAGNOSIS — F17200 Nicotine dependence, unspecified, uncomplicated: Secondary | ICD-10-CM | POA: Insufficient documentation

## 2014-03-15 DIAGNOSIS — J069 Acute upper respiratory infection, unspecified: Secondary | ICD-10-CM

## 2014-03-15 DIAGNOSIS — R6883 Chills (without fever): Secondary | ICD-10-CM | POA: Insufficient documentation

## 2014-03-15 LAB — RAPID STREP SCREEN (MED CTR MEBANE ONLY): STREPTOCOCCUS, GROUP A SCREEN (DIRECT): NEGATIVE

## 2014-03-15 MED ORDER — IBUPROFEN 800 MG PO TABS
800.0000 mg | ORAL_TABLET | Freq: Once | ORAL | Status: AC
  Administered 2014-03-15: 800 mg via ORAL
  Filled 2014-03-15: qty 1

## 2014-03-15 MED ORDER — ALBUTEROL SULFATE HFA 108 (90 BASE) MCG/ACT IN AERS: 2.0000 | INHALATION_SPRAY | Freq: Four times a day (QID) | RESPIRATORY_TRACT | Status: DC | PRN

## 2014-03-15 NOTE — Discharge Instructions (Signed)
Salt Water Gargle This solution will help make your mouth and throat feel better. HOME CARE INSTRUCTIONS   Mix 1 teaspoon of salt in 8 ounces of warm water.  Gargle with this solution as much or often as you need or as directed. Swish and gargle gently if you have any sores or wounds in your mouth.  Do not swallow this mixture. Document Released: 05/02/2004 Document Revised: 10/21/2011 Document Reviewed: 09/23/2008 [facility] Patient Information 2015 [place], [place]. This information is not intended to replace advice given to you by your health care provider. Make sure you discuss any questions you have with your health care provider. Upper Respiratory Infection, Adult An upper respiratory infection (URI) is also known as the common cold. It is often caused by a type of germ (virus). Colds are easily spread (contagious). You can pass it to others by kissing, coughing, sneezing, or drinking out of the same glass. Usually, you get better in 1 or 2 weeks.  HOME CARE   Only take medicine as told by your doctor.  Use a warm mist humidifier or breathe in steam from a hot shower.  Drink enough water and fluids to keep your pee (urine) clear or pale yellow.  Get plenty of rest.  Return to work when your temperature is back to normal or as told by your doctor. You may use a face mask and wash your hands to stop your cold from spreading. GET HELP RIGHT AWAY IF:   After the first few days, you feel you are getting worse.  You have questions about your medicine.  You have chills, shortness of breath, or brown or red spit (mucus).  You have yellow or brown snot (nasal discharge) or pain in the face, especially when you bend forward.  You have a fever, puffy (swollen) neck, pain when you swallow, or white spots in the back of your throat.  You have a bad headache, ear pain, sinus pain, or chest pain.  You have a high-pitched whistling sound when you breathe in and out (wheezing).  You have a  lasting cough or cough up blood.  You have sore muscles or a stiff neck. MAKE SURE YOU:   Understand these instructions.  Will watch your condition.  Will get help right away if you are not doing well or get worse. Document Released: 01/15/2008 Document Revised: 10/21/2011 Document Reviewed: 11/03/2013 [facility] Patient Information 2015 [place], [place]. This information is not intended to replace advice given to you by your health care provider. Make sure you discuss any questions you have with your health care provider. Sore Throat A sore throat is pain, burning, irritation, or scratchiness of the throat. There is often pain or tenderness when swallowing or talking. A sore throat may be accompanied by other symptoms, such as coughing, sneezing, fever, and swollen neck glands. A sore throat is often the first sign of another sickness, such as a cold, flu, strep throat, or mononucleosis (commonly known as mono). Most sore throats go away without medical treatment. CAUSES  The most common causes of a sore throat include:  A viral infection, such as a cold, flu, or mono.  A bacterial infection, such as strep throat, tonsillitis, or whooping cough.  Seasonal allergies.  Dryness in the air.  Irritants, such as smoke or pollution.  Gastroesophageal reflux disease (GERD). HOME CARE INSTRUCTIONS   Only take over-the-counter medicines as directed by your caregiver.  Drink enough fluids to keep your urine clear or pale yellow.  Rest as  needed.  Try using throat sprays, lozenges, or sucking on hard candy to ease any pain (if older than 4 years or as directed).  Sip warm liquids, such as broth, herbal tea, or warm water with honey to relieve pain temporarily. You may also eat or drink cold or frozen liquids such as frozen ice pops.  Gargle with salt water (mix 1 tsp salt with 8 oz of water).  Do not smoke and avoid secondhand smoke.  Put a cool-mist humidifier in your bedroom at night  to moisten the air. You can also turn on a hot shower and sit in the bathroom with the door closed for 5-10 minutes. SEEK IMMEDIATE MEDICAL CARE IF:  You have difficulty breathing.  You are unable to swallow fluids, soft foods, or your saliva.  You have increased swelling in the throat.  Your sore throat does not get better in 7 days.  You have nausea and vomiting.  You have a fever or persistent symptoms for more than 2-3 days.  You have a fever and your symptoms suddenly get worse. MAKE SURE YOU:   Understand these instructions.  Will watch your condition.  Will get help right away if you are not doing well or get worse. Document Released: 09/05/2004 Document Revised: 07/15/2012 Document Reviewed: 04/05/2012 [facility] Patient Information 2015 [place], [place]. This information is not intended to replace advice given to you by your health care provider. Make sure you discuss any questions you have with your health care provider.

## 2014-03-15 NOTE — ED Notes (Signed)
Pt reports sore throat that started last night

## 2014-03-15 NOTE — ED Provider Notes (Signed)
Medical screening examination/treatment/procedure(s) were performed by non-physician practitioner and as supervising physician I was immediately available for consultation/collaboration.   EKG Interpretation None       Ethelda ChickMartha K Linker, MD 03/15/14 [phone]

## 2014-03-15 NOTE — ED Provider Notes (Signed)
CSN: [id number]     Arrival date & time 03/15/14  1433 History   First MD Initiated Contact with Patient 03/15/14 1455     Chief Complaint  Patient presents with  . Sore Throat     (Consider location/radiation/quality/duration/timing/severity/associated sxs/prior Treatment) Patient is a 33 y.o. female presenting with pharyngitis. The history is provided by the patient. No language interpreter was used.  Sore Throat This is a new problem. The current episode started yesterday. The problem occurs constantly. Associated symptoms include chills, coughing, myalgias and a sore throat. Pertinent negatives include no abdominal pain, fever, rash or vomiting. Associated symptoms comments: Sore throat, cough, SOB and general malaise that started yesterday. 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 No other workers are ill or have symptoms. No history of asthma. No known fever. .    Past Medical History  Diagnosis Date  . Hemorrhoid   . Constipation   . Ectopic pregnancy    Past Surgical History  Procedure Laterality Date  . Tubal ligation     Family History  Problem Relation Age of Onset  . Hypertension Mother   . Arthritis Mother   . Lupus Mother   . Diabetes Sister    History  Substance Use Topics  . Smoking status: Current Every Day Smoker -- 1.00 packs/day for 15 years    Types: Cigarettes  . Smokeless tobacco: Never Used  . Alcohol Use: 8.4 oz/week    14 Cans of beer per week     Comment: weekends   OB History   Grav Para Term Preterm Abortions TAB SAB Ect Mult Living   1 0 0 0 1 0 0 1       Review of Systems  Constitutional: Positive for chills. Negative for fever.  HENT: Positive for sore throat. Negative for trouble swallowing.   Respiratory: Positive for cough and shortness of breath.   Cardiovascular: Negative.    Gastrointestinal: Negative.  Negative for vomiting and abdominal pain.  Musculoskeletal: Positive for myalgias.  Skin: Negative.  Negative for rash.  Neurological: Negative.       Allergies  Review of patient's allergies indicates no known allergies.  Home Medications   Prior to Admission medications   Medication Sig Start Date End Date Taking? Authorizing Provider  amoxicillin-clavulanate (AUGMENTIN) 875-125 MG per tablet Take 1 tablet by mouth every 12 (twelve) hours. 11/05/13   Toy BakerAnthony T Allen, MD  clonazePAM (KLONOPIN) 0.5 MG tablet Take 0.5 mg by mouth at bedtime as needed for anxiety (take 1/2- 1 tablet).    Historical Provider, MD  fluconazole (DIFLUCAN) 150 MG tablet Take 1 tablet (150 mg total) by mouth once. 11/24/13   Willodean Rosenthalarolyn Harraway-Smith, MD  HYDROcodone-acetaminophen (NORCO/VICODIN) 5-325 MG per tablet Take 1-2 tablets by mouth every 6 (six) hours as needed for moderate pain. 11/05/13   Toy BakerAnthony T Allen, MD  hydrocortisone (ANUSOL-HC) 25 MG suppository Place 1 suppository (25 mg total) rectally 2 (two) times daily. For 7 days 04/13/13   Richardean Canalavid H Yao, MD  hydrocortisone-pramoxine [facility](PROCTOFOAM-HC) rectal foam Place 1 applicator rectally 2 (two) times daily.    Historical Provider, MD  ibuprofen (ADVIL,MOTRIN) 800 MG tablet Take 800 mg by mouth every 8 (eight) hours as needed.    Historical Provider, MD  metroNIDAZOLE (FLAGYL) 500 MG tablet Take 1 tablet (500 mg total) by mouth 2 (two)  times daily. 11/05/13   Toy Baker, MD  nitrofurantoin, macrocrystal-monohydrate, (MACROBID) 100 MG capsule Take 1 capsule (100 mg total) by mouth 2 (two) times daily. X 7 days 11/05/13   Hanley Seamen, MD  oxyCODONE-acetaminophen (PERCOCET/ROXICET) 5-325 MG per tablet Take 1-2 tablets by mouth every 6 (six) hours as needed for pain. 06/21/12   Geoffery Lyons, MD  traMADol (ULTRAM) 50 MG tablet Take 50 mg by mouth every 6 (six) hours as needed for pain.    Historical Provider, MD   BP 115/64  Pulse  85  Temp(Src) 98.1 F (36.7 C) (Oral)  Resp 16  Ht 5\' 8"  (1.727 m)  Wt 165 lb (74.844 kg)  BMI 25.09 kg/m2  SpO2 99%  LMP 02/18/2014 Physical Exam  Constitutional: She is oriented to person, place, and time. She appears well-developed and well-nourished.  HENT:  Head: Normocephalic.  Oropharynx is red without significant tonsillar swelling or exudates. Uvula midline.  Neck: Normal range of motion. Neck supple.  Cardiovascular: Normal rate and regular rhythm.   Pulmonary/Chest: Effort normal and breath sounds normal.  Abdominal: Soft. Bowel sounds are normal. There is no tenderness. There is no rebound and no guarding.  Musculoskeletal: Normal range of motion.  Neurological: She is alert and oriented to person, place, and time.  Skin: Skin is warm and dry. No rash noted.  Psychiatric: She has a normal mood and affect.    ED Course  Procedures (including critical care time) Labs Review Labs Reviewed  RAPID STREP SCREEN  CULTURE, GROUP A STREP    Imaging Review Dg Chest 2 View  03/15/2014   CLINICAL DATA:  Shortness of breath  EXAM: CHEST  2 VIEW  COMPARISON:  06/25/2012  FINDINGS: Cardiac shadow is within normal limits. The lungs are clear bilaterally.  IMPRESSION: No active cardiopulmonary disease.   Electronically Signed   By: Alcide Clever M.D.   On: 03/15/2014 15:46     EKG Interpretation None      MDM   Final diagnoses:  None    1. Pharyngitis 2. Viral URI  The patient appears stable, normal VS, normal O2 saturation, no significant or concerning abnormalities on exam. Discussed supportive care measures and PCP follow up.    Arnoldo Hooker, PA-C 03/15/14 1649

## 2014-03-17 LAB — CULTURE, GROUP A STREP

## 2014-06-13 ENCOUNTER — Encounter (HOSPITAL_BASED_OUTPATIENT_CLINIC_OR_DEPARTMENT_OTHER): Payer: Self-pay | Admitting: Emergency Medicine

## 2014-10-17 IMAGING — US US TRANSVAGINAL NON-OB
1 series · 13 of 25 positions shown · non-contrast
Comparison: US TRANSVAGINAL NON-OB dated 11/05/2013; CT ABD/PELV WO
CM dated 06/21/2012; US TRANSVAGINAL NON-OB dated 07/06/2010

CLINICAL DATA: Pain. Prior history of ectopic pregnancy.

EXAM:
TRANSABDOMINAL ULTRASOUND OF PELVIS
TECHNIQUE: Transabdominal ultrasound examination of the pelvis was performed
including evaluation of the uterus, ovaries, adnexal regions, and
pelvic cul-de-sac.

[Series 1: us transvaginal non-ob · 0.26mm/px · 79 acquisitions, 13 frames shown]
[im 1/79]
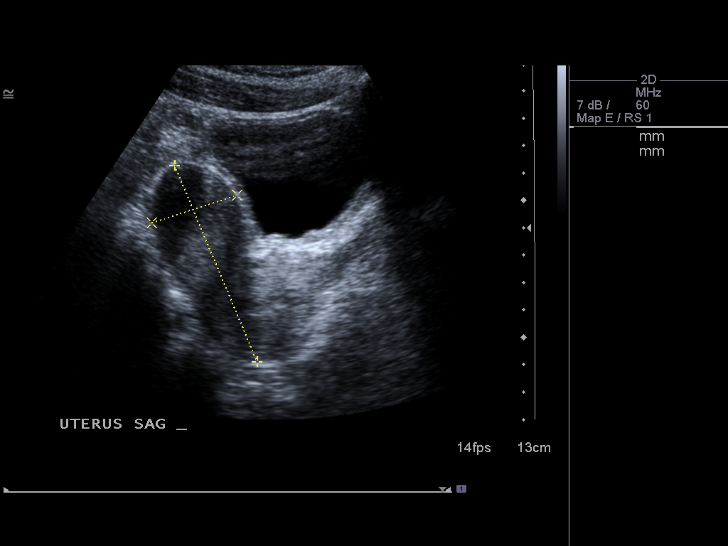
[im 7/79]
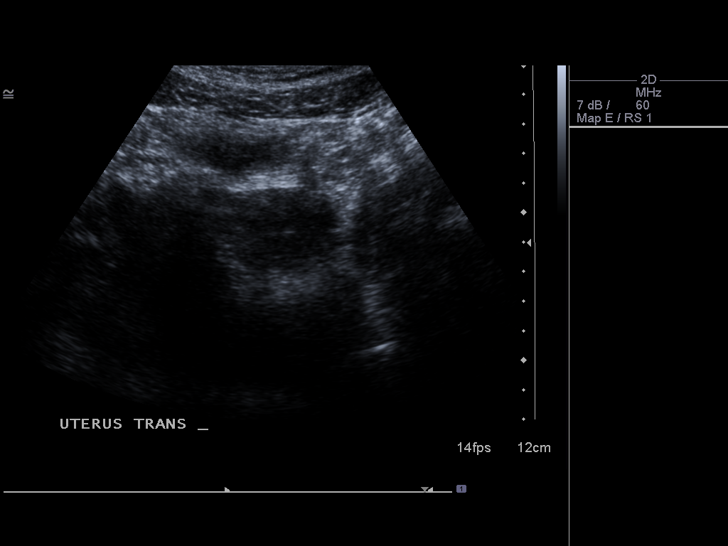
[im 14/79]
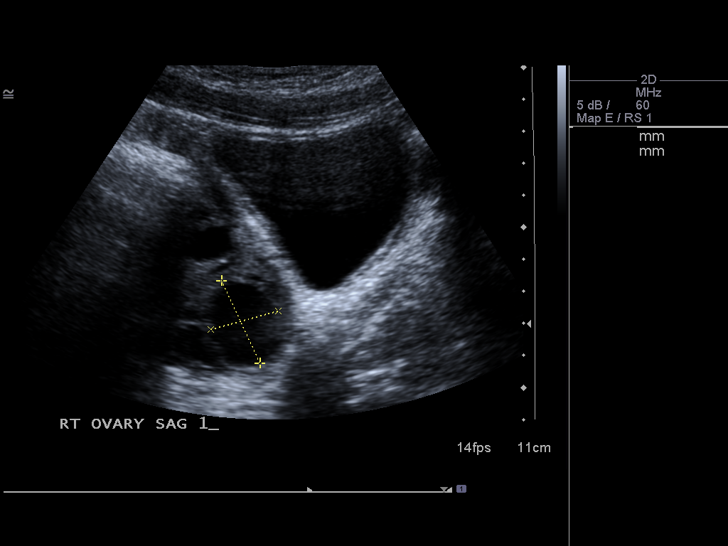
[im 20/79]
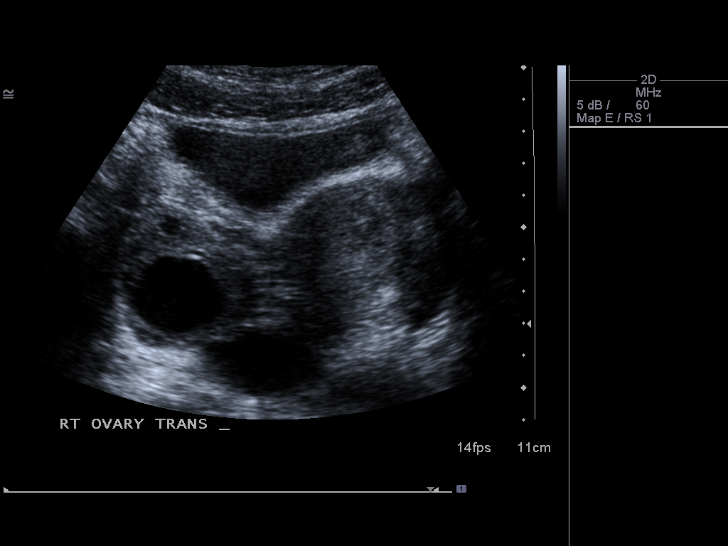
[im 27/79]
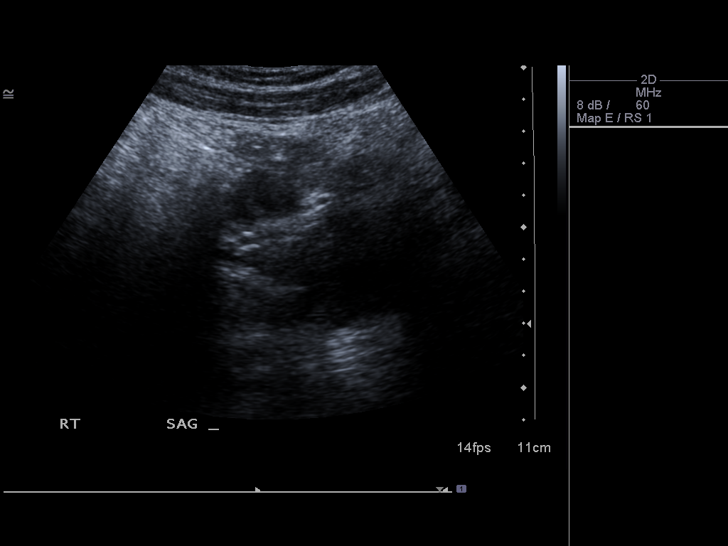
[im 33/79]
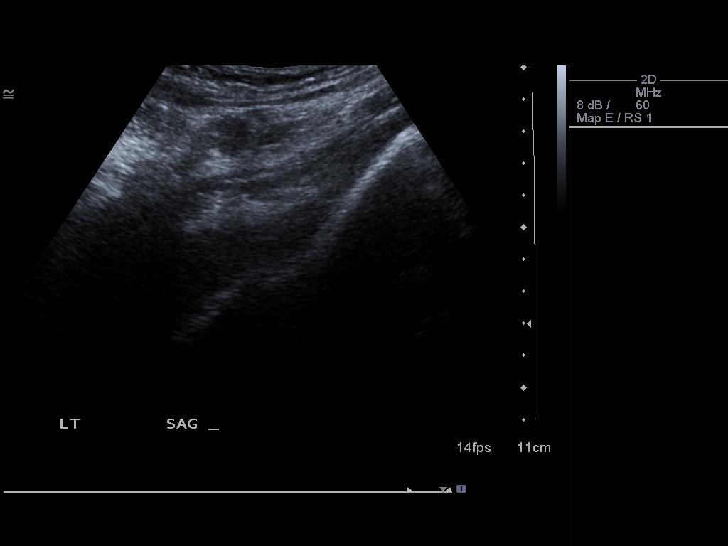
[im 40/79]
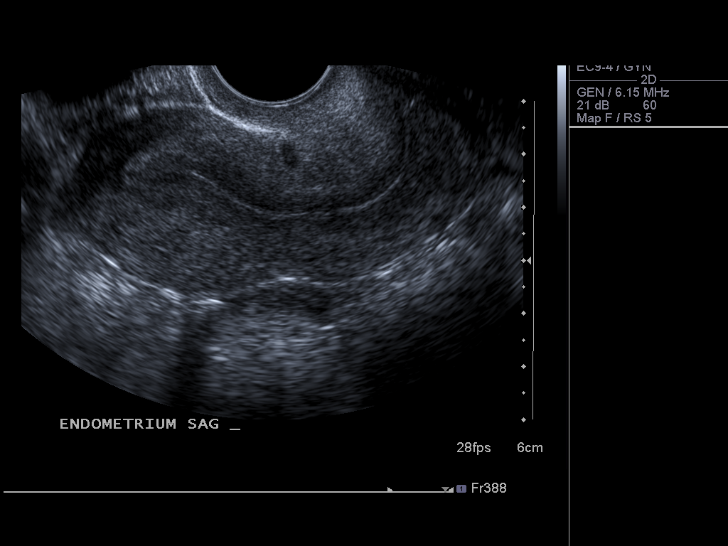
[im 46/79]
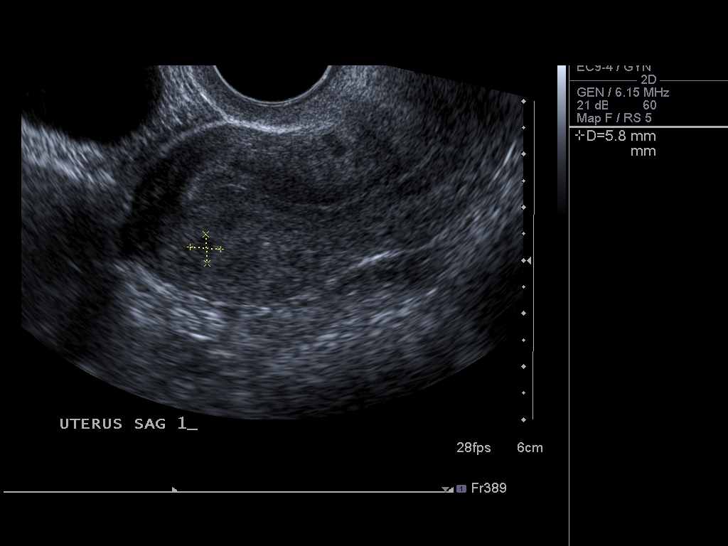
[im 53/79]
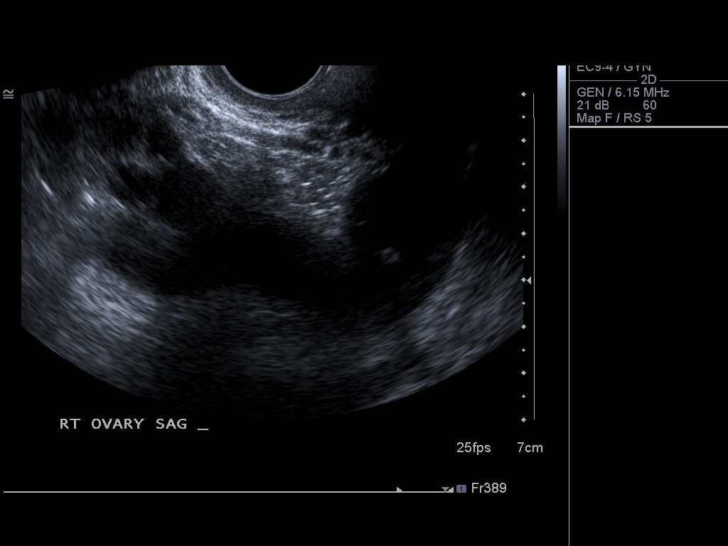
[im 59/79]
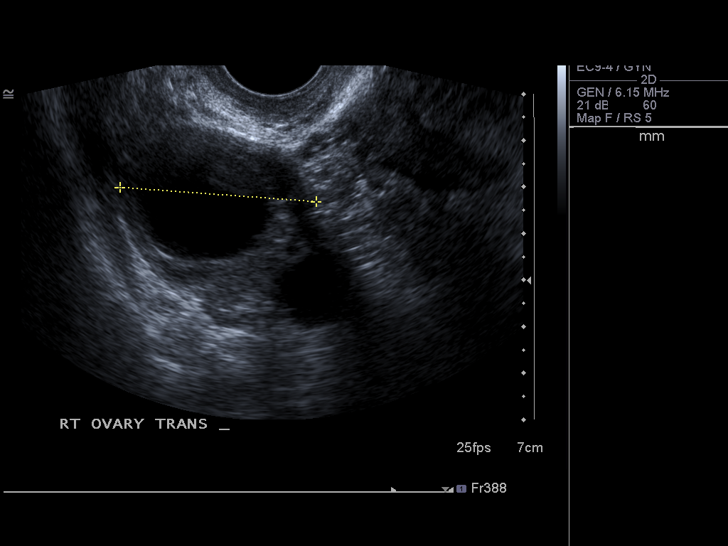
[im 66/79]
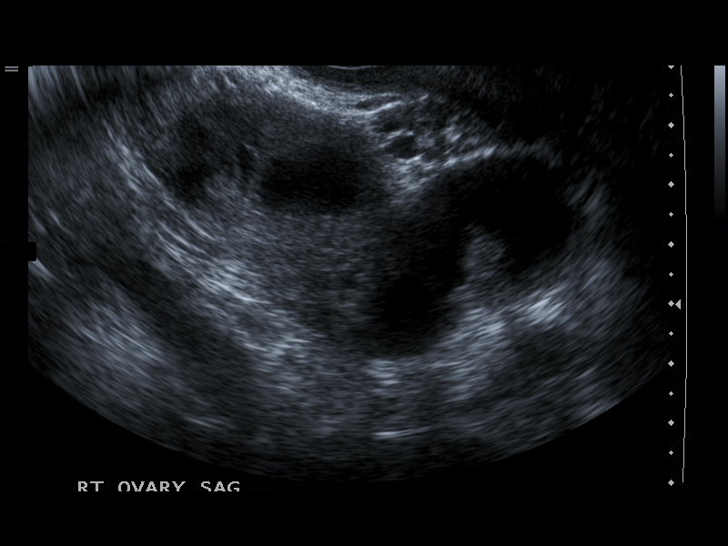
[im 72/79]
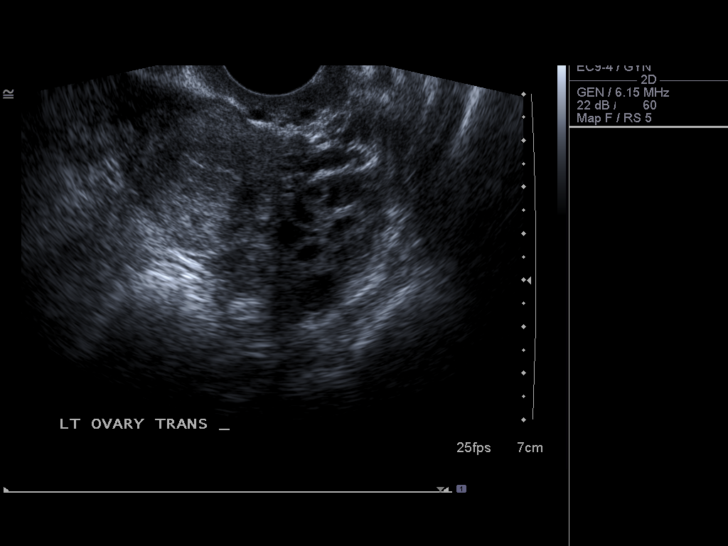
[im 79/79]
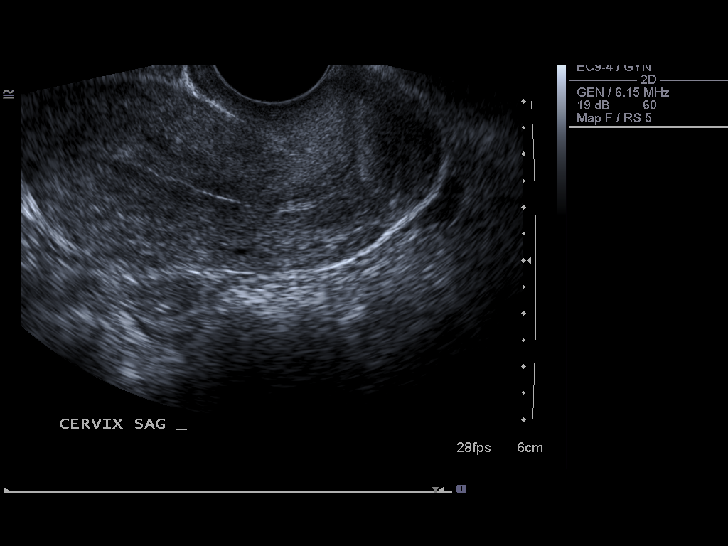

[13 of 25 positions shown; findings below may reference images not displayed]

FINDINGS: Uterus

Measurements: 7.8 x 3.3 x 4.6 cm . Tiny 0.6 cm-4 region that noted
in the posterior fundus, most likely tiny fibroid.

Endometrium

Thickness: 11 mm . No focal abnormality visualized.

Right ovary

Measurements: 5.0 x 3.4 x 4.2 cm. Multiple follicular cysts are
noted the largest measuring 3.3 cm in maximum diameter. . A
prominent tubular non peristalsing fluid-filled structure is noted
in the right adnexal region adjacent to the ovary. This could
represent a prominent fallopian tube/hydrosalpinx. The possibility
of gynecologic pathology including tubo-ovarian abscess and or
ectopic pregnancy cannot be excluded.

Left ovary

Measurements: 4.1 x 2.6 x 2.4 cm . Multiple small follicles noted.

Other findings: Small amount.
IMPRESSION: Prominent tubular fluid-filled non peristalsing structure noted
adjacent to the right ovary. This suggested a fluid-filled fallopian
tube/hydrosalpinx. Gynecologic pathology including tubo-ovarian
abscess and/or ectopic pregnancy cannot be excluded. These results
will be called to the ordering clinician or representative by the
Radiologist Assistant, and communication documented in the PACS
Dashboard.

## 2015-02-24 IMAGING — CR DG CHEST 2V
2 series · 2 of 2 positions shown · non-contrast
Comparison: 06/25/2012

CLINICAL DATA: Shortness of breath

EXAM:
CHEST  2 VIEW

[w chest pa]
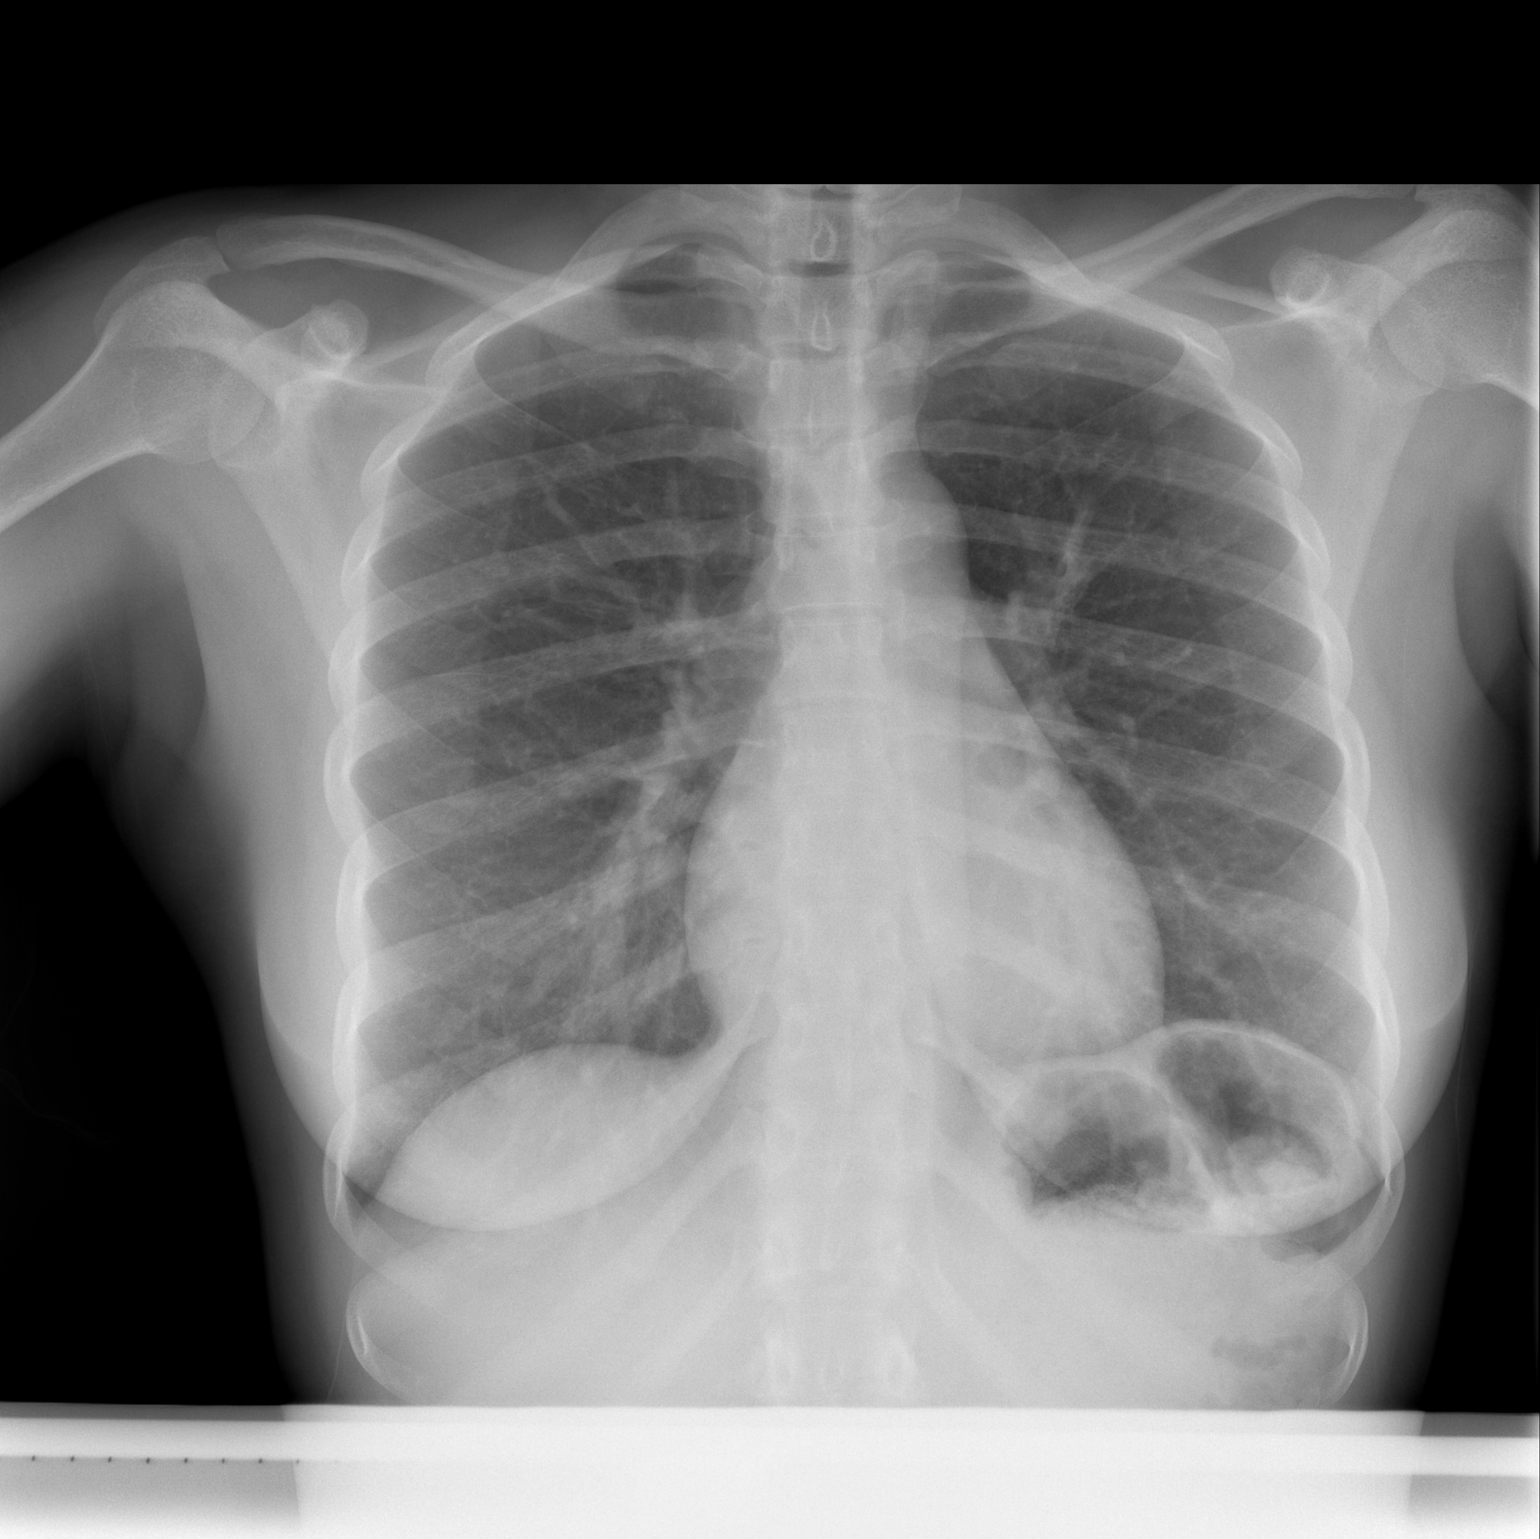

[w chest lat]
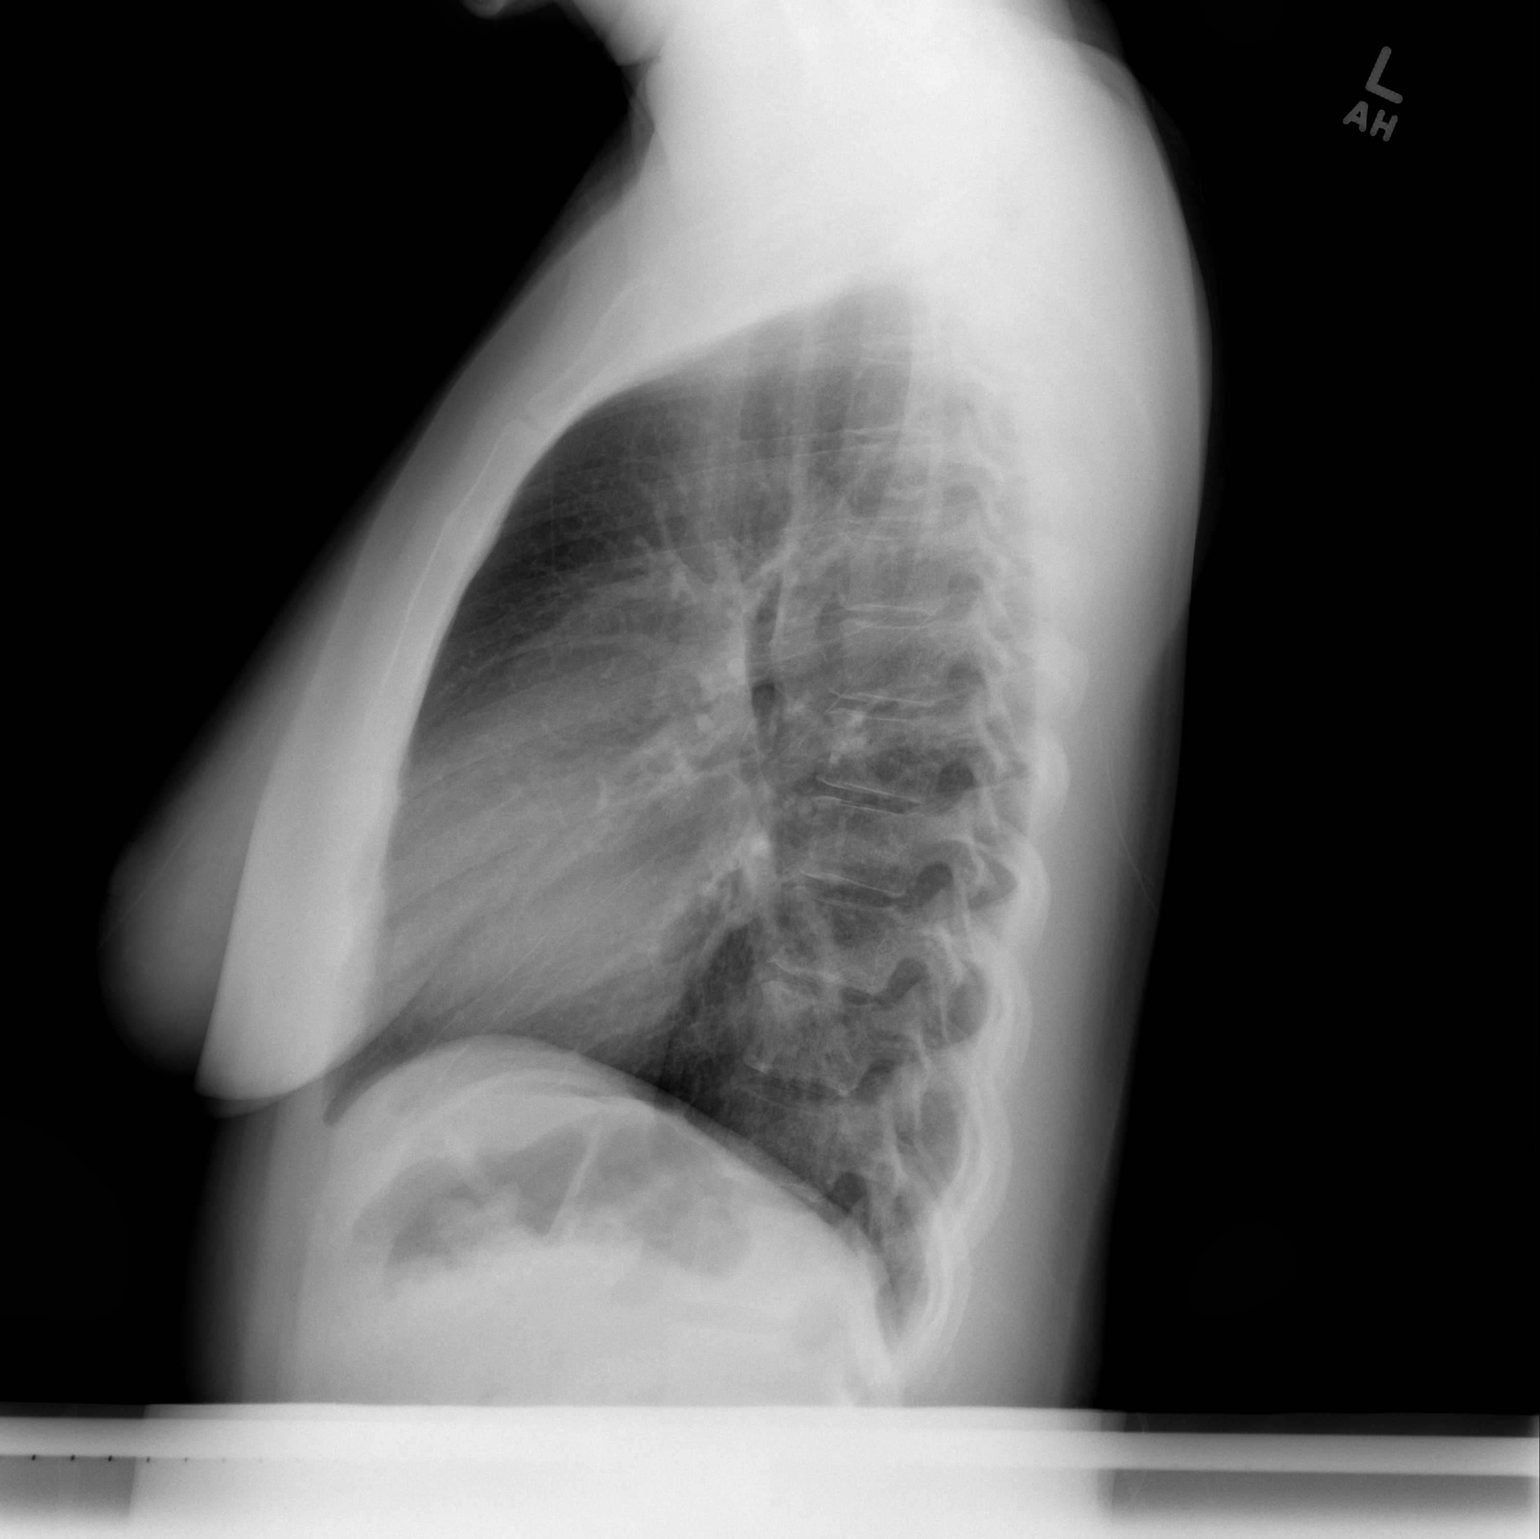

[2 of 2 positions shown; findings below may reference images not displayed]

FINDINGS: Cardiac shadow is within normal limits. The lungs are clear
bilaterally.
IMPRESSION: No active cardiopulmonary disease.

## 2016-11-09 ENCOUNTER — Emergency Department (HOSPITAL_BASED_OUTPATIENT_CLINIC_OR_DEPARTMENT_OTHER)
Admission: EM | Admit: 2016-11-09 | Discharge: 2016-11-09 | Disposition: A | Discharge status: Home / Self Care | Payer: BLUE CROSS/BLUE SHIELD | Attending: Emergency Medicine | Admitting: Emergency Medicine

## 2016-11-09 ENCOUNTER — Encounter (HOSPITAL_BASED_OUTPATIENT_CLINIC_OR_DEPARTMENT_OTHER): Payer: Self-pay | Admitting: *Deleted

## 2016-11-09 DIAGNOSIS — H578 Other specified disorders of eye and adnexa: Secondary | ICD-10-CM | POA: Diagnosis present

## 2016-11-09 DIAGNOSIS — F17210 Nicotine dependence, cigarettes, uncomplicated: Secondary | ICD-10-CM | POA: Diagnosis not present

## 2016-11-09 DIAGNOSIS — H10021 Other mucopurulent conjunctivitis, right eye: Secondary | ICD-10-CM

## 2016-11-09 MED ORDER — CIPROFLOXACIN HCL 0.3 % OP SOLN
2.0000 [drp] | OPHTHALMIC | Status: DC
  Administered 2016-11-09: 2 [drp] via OPHTHALMIC
  Filled 2016-11-09: qty 2.5

## 2016-11-09 NOTE — ED Provider Notes (Signed)
   [facility] Provider Note: Lowella Dell, MD, FACEP  CSN: [id number] MRN: [id number] ARRIVAL: 11/09/16 at 0049 ROOM: [facility]   CHIEF COMPLAINT  Eye Drainage   HISTORY OF PRESENT ILLNESS  Sara Morris is a 36 y.o. female who was recently treated by her PCP for flulike symptoms. She is here with acute irritation of the right eye which manifested itself over the past several hours. Her right eye is burning. There is mucoid drainage and she is having difficulty opening her right eye due to the discomfort. There is no involvement of the left eye at this point. Her vision is blurred in the right eye but she was not able to comply with the visual acuity exam.   Past Medical History:  Diagnosis Date  . Constipation   . Ectopic pregnancy   . Hemorrhoid     Past Surgical History:  Procedure Laterality Date  . TUBAL LIGATION      Family History  Problem Relation Age of Onset  . Hypertension Mother   . Arthritis Mother   . Lupus Mother   . Diabetes Sister     Social History  Substance Use Topics  . Smoking status: Current Every Day Smoker    Packs/day: 1.00    Years: 15.00    Types: Cigarettes  . Smokeless tobacco: Never Used  . Alcohol use 8.4 oz/week    14 Cans of beer per week     Comment: weekends    Prior to Admission medications   Not on File    Allergies Patient has no known allergies.   REVIEW OF SYSTEMS  Negative except as noted here or in the History of Present Illness.   PHYSICAL EXAMINATION  Initial Vital Signs Blood pressure 109/77, pulse (!) 103, temperature 98.1 F (36.7 C), temperature source Oral, resp. rate 18, height  (1.702 m), weight 167 lb (75.8 kg), last menstrual period 11/04/2016, SpO2 97 %.  Examination General: Well-developed, well-nourished female in no acute distress; appearance consistent with age of record HENT: normocephalic; atraumatic Eyes: pupils equal, round and reactive to light; extraocular muscles  intact; right conjunctival edema with mucoid discharge Neck: supple Heart: regular rate and rhythm Lungs: clear to auscultation bilaterally Abdomen: soft; nondistended Extremities: No deformity; full range of motion Neurologic: Awake, alert and oriented; motor function intact in all extremities and symmetric; no facial droop Skin: Warm and dry Psychiatric: Normal mood and affect   RESULTS  Summary of this visit's results, reviewed by myself:   EKG Interpretation  Date/Time:    Ventricular Rate:    PR Interval:    QRS Duration:   QT Interval:    QTC Calculation:   R Axis:     Text Interpretation:        Laboratory Studies: No results found for this or any previous visit (from the past 24 hour(s)). Imaging Studies: No results found.  ED COURSE  Nursing notes and initial vitals signs, including pulse oximetry, reviewed.  Vitals:   11/09/16 0055 11/09/16 0056  BP: 109/77   Pulse: (!) 103   Resp: 18   Temp: 98.1 F (36.7 C)   TempSrc: Oral   SpO2: 97%   Weight:  167 lb (75.8 kg)  Height:   (1.702 m)    PROCEDURES    ED DIAGNOSES     ICD-9-CM ICD-10-CM   1. Catarrhal conjunctivitis, right 372.03 H10.021        Paula Libra, MD 11/09/16 [phone]

## 2016-11-09 NOTE — ED Triage Notes (Signed)
Pt with right eye redness itching and drainage
